# Patient Record
Sex: Male | Born: 1985 | Race: Black or African American | Hispanic: No | Marital: Single | Smoking: Former smoker
Health system: Southern US, Community
[De-identification: ages and names within clinical notes are randomized; demographics above are authoritative.]

## PROBLEM LIST (undated history)

## (undated) DIAGNOSIS — J45909 Unspecified asthma, uncomplicated: Secondary | ICD-10-CM

## (undated) HISTORY — PX: ANTERIOR CRUCIATE LIGAMENT REPAIR: SHX115

## (undated) HISTORY — DX: Unspecified asthma, uncomplicated: J45.909

---

## 2005-04-01 ENCOUNTER — Emergency Department (HOSPITAL_COMMUNITY): Admission: EM | Admit: 2005-04-01 | Discharge: 2005-04-01 | Payer: Self-pay | Admitting: Emergency Medicine

## 2009-01-21 ENCOUNTER — Emergency Department (HOSPITAL_BASED_OUTPATIENT_CLINIC_OR_DEPARTMENT_OTHER): Admission: EM | Admit: 2009-01-21 | Discharge: 2009-01-21 | Payer: Self-pay | Admitting: Emergency Medicine

## 2009-06-30 ENCOUNTER — Emergency Department (HOSPITAL_BASED_OUTPATIENT_CLINIC_OR_DEPARTMENT_OTHER): Admission: EM | Admit: 2009-06-30 | Discharge: 2009-06-30 | Payer: Self-pay | Admitting: Emergency Medicine

## 2009-07-23 ENCOUNTER — Emergency Department (HOSPITAL_BASED_OUTPATIENT_CLINIC_OR_DEPARTMENT_OTHER): Admission: EM | Admit: 2009-07-23 | Discharge: 2009-07-23 | Payer: Self-pay | Admitting: Emergency Medicine

## 2010-04-21 ENCOUNTER — Emergency Department (HOSPITAL_BASED_OUTPATIENT_CLINIC_OR_DEPARTMENT_OTHER): Admission: EM | Admit: 2010-04-21 | Discharge: 2010-04-21 | Payer: Self-pay | Admitting: Emergency Medicine

## 2010-11-24 LAB — RAPID STREP SCREEN (MED CTR MEBANE ONLY): Streptococcus, Group A Screen (Direct): NEGATIVE

## 2012-07-15 ENCOUNTER — Ambulatory Visit: Payer: BC Managed Care – PPO

## 2012-07-15 ENCOUNTER — Ambulatory Visit (INDEPENDENT_AMBULATORY_CARE_PROVIDER_SITE_OTHER): Payer: BC Managed Care – PPO | Admitting: Family Medicine

## 2012-07-15 VITALS — BP 152/75 | HR 80 | Temp 98.0°F | Resp 16 | Ht 74.0 in | Wt 215.0 lb

## 2012-07-15 DIAGNOSIS — M25572 Pain in left ankle and joints of left foot: Secondary | ICD-10-CM

## 2012-07-15 DIAGNOSIS — M25521 Pain in right elbow: Secondary | ICD-10-CM

## 2012-07-15 DIAGNOSIS — T07XXXA Unspecified multiple injuries, initial encounter: Secondary | ICD-10-CM

## 2012-07-15 DIAGNOSIS — M25569 Pain in unspecified knee: Secondary | ICD-10-CM

## 2012-07-15 DIAGNOSIS — M25529 Pain in unspecified elbow: Secondary | ICD-10-CM

## 2012-07-15 DIAGNOSIS — Z23 Encounter for immunization: Secondary | ICD-10-CM

## 2012-07-15 MED ORDER — IBUPROFEN 600 MG PO TABS: 600.0000 mg | ORAL_TABLET | Freq: Three times a day (TID) | ORAL | Status: DC | PRN

## 2012-07-15 MED ORDER — DOXYCYCLINE HYCLATE 100 MG PO TABS: 100.0000 mg | ORAL_TABLET | Freq: Two times a day (BID) | ORAL | Status: DC

## 2012-07-15 MED ORDER — TETANUS-DIPHTH-ACELL PERTUSSIS 5-2.5-18.5 LF-MCG/0.5 IM SUSP: 0.5000 mL | Freq: Once | INTRAMUSCULAR | Status: DC
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
## 2012-07-17 ENCOUNTER — Ambulatory Visit (INDEPENDENT_AMBULATORY_CARE_PROVIDER_SITE_OTHER): Payer: BC Managed Care – PPO | Admitting: Emergency Medicine

## 2012-07-17 VITALS — BP 134/77 | HR 67 | Temp 98.0°F | Resp 16 | Ht 75.0 in | Wt 212.0 lb

## 2012-07-17 DIAGNOSIS — T07XXXA Unspecified multiple injuries, initial encounter: Secondary | ICD-10-CM

## 2012-07-17 DIAGNOSIS — IMO0002 Reserved for concepts with insufficient information to code with codable children: Secondary | ICD-10-CM
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
## 2012-07-30 NOTE — Progress Notes (Signed)
Reviewed and agree.

## 2013-08-05 ENCOUNTER — Ambulatory Visit (INDEPENDENT_AMBULATORY_CARE_PROVIDER_SITE_OTHER): Payer: BC Managed Care – PPO | Admitting: Emergency Medicine

## 2013-08-05 VITALS — BP 132/80 | HR 74 | Temp 98.3°F | Resp 16 | Ht 75.0 in | Wt 219.0 lb

## 2013-08-05 DIAGNOSIS — S139XXA Sprain of joints and ligaments of unspecified parts of neck, initial encounter: Secondary | ICD-10-CM

## 2013-08-05 MED ORDER — NAPROXEN SODIUM 550 MG PO TABS: 550.0000 mg | ORAL_TABLET | Freq: Two times a day (BID) | ORAL | Status: AC

## 2013-08-05 MED ORDER — CYCLOBENZAPRINE HCL 10 MG PO TABS: 10.0000 mg | ORAL_TABLET | Freq: Three times a day (TID) | ORAL | Status: DC | PRN

## 2013-08-05 MED ORDER — HYDROCODONE-ACETAMINOPHEN 5-325 MG PO TABS: 1.0000 | ORAL_TABLET | ORAL | Status: DC | PRN

## 2013-08-05 NOTE — Progress Notes (Signed)
[facility] [address], [place] [address] [phone]  Date:  08/05/2013   Name:  Lawrence David   DOB:  1985/10/07   MRN:  [id number]  PCP:  No primary provider on file.    Chief Complaint: Neck Pain   History of Present Illness:  Lawrence David is a 27 y.o. very pleasant male patient who presents with the following:  Working out last week and awoke with pain across his shoulders and into neck.  Non radiating no neuro symptoms.  No history of injury.  No prior neck injury.  Works Teacher, adult education into components for cabinets.  No headache or back pain.  No improvement with over the counter medications or other home remedies. Denies other complaint or health concern today.   There are no active problems to display for this patient.   Past Medical History  Diagnosis Date  . Asthma     Past Surgical History  Procedure Laterality Date  . Anterior cruciate ligament repair      History  Substance Use Topics  . Smoking status: Former Smoker -- 8 years    Types: Cigars  . Smokeless tobacco: Not on file  . Alcohol Use: Not on file    Family History  Problem Relation Age of Onset  . Hyperlipidemia Father     No Known Allergies  Medication list has been reviewed and updated.  No current outpatient prescriptions on file prior to visit.   No current facility-administered medications on file prior to visit.    Review of Systems:  As per HPI, otherwise negative.    Physical Examination: Filed Vitals:   08/05/13 1424  BP: 132/80  Pulse: 74  Temp: 98.3 F (36.8 C)  Resp: 16   Filed Vitals:   08/05/13 1424  Height: 6\' 3"  (1.905 m)  Weight: 219 lb (99.338 kg)   Body mass index is 27.37 kg/(m^2). Ideal Body Weight: Weight in (lb) to have BMI = 25: 199.6   GEN: WDWN, moderate distressAD, Non-toxic, Alert & Oriented x 3 HEENT: Atraumatic, Normocephalic.  Ears and Nose: No external deformity. EXTR: No  clubbing/cyanosis/edema NEURO: Normal gait.  PSYCH: Normally interactive. Conversant. Not depressed or anxious appearing.  Calm demeanor.  NECK:  Tender with spasm of trapezius.  Neuro intact Back:  Supple and not tender  Assessment and Plan: Cervical strain Anaprox Flexeril vicodin   Signed,  Phillips Odor, MD

## 2013-08-05 NOTE — Patient Instructions (Signed)

## 2015-04-02 ENCOUNTER — Emergency Department (HOSPITAL_BASED_OUTPATIENT_CLINIC_OR_DEPARTMENT_OTHER)
Admission: EM | Admit: 2015-04-02 | Discharge: 2015-04-03 | Disposition: A | Discharge status: Home / Self Care | Payer: BLUE CROSS/BLUE SHIELD | Attending: Emergency Medicine | Admitting: Emergency Medicine

## 2015-04-02 ENCOUNTER — Encounter (HOSPITAL_BASED_OUTPATIENT_CLINIC_OR_DEPARTMENT_OTHER): Payer: Self-pay

## 2015-04-02 DIAGNOSIS — L0231 Cutaneous abscess of buttock: Secondary | ICD-10-CM | POA: Diagnosis present

## 2015-04-02 DIAGNOSIS — J45909 Unspecified asthma, uncomplicated: Secondary | ICD-10-CM | POA: Diagnosis not present

## 2015-04-02 DIAGNOSIS — Z87891 Personal history of nicotine dependence: Secondary | ICD-10-CM | POA: Diagnosis not present

## 2015-04-02 MED ORDER — LIDOCAINE-EPINEPHRINE 2 %-1:100000 IJ SOLN
INTRAMUSCULAR | Status: AC
  Filled 2015-04-02: qty 1

## 2015-04-02 NOTE — Discharge Instructions (Signed)

## 2015-04-02 NOTE — ED Provider Notes (Addendum)
CSN: [id number]     Arrival date & time 04/02/15  2329 History   This chart was scribed for Lawrence Libra, MD by Arlan Organ, ED Scribe. This patient was seen in room MH02/MH02 and the patient's care was started 11:40 PM.   Chief Complaint  Patient presents with  . Abscess   HPI  HPI Comments: MACKEY VARRICCHIO is a 29 y.o. male without any pertinent past medical history who presents to the Emergency Department here for an abscess to the R inner buttock x 3-4 days. Pt states area is painful, itchy, and swollen. Pain is moderate to severe, worse when attempting to sit down and when ambulating. No alleviating factors at this time. OTC ibuprofen attempted at home with no improvement of symptoms. Topical anti-itch ointment also attempted with no relief. No recent fever or chills. No previous history of same requiring I&D treatment.  Past Medical History  Diagnosis Date  . Asthma    Past Surgical History  Procedure Laterality Date  . Anterior cruciate ligament repair     Family History  Problem Relation Age of Onset  . Hyperlipidemia Father    Social History  Substance Use Topics  . Smoking status: Former Smoker -- 8 years    Types: Cigars  . Smokeless tobacco: None  . Alcohol Use: Yes     Comment: social    Review of Systems  All other systems reviewed and are negative.   Allergies  Review of patient's allergies indicates no known allergies.  Home Medications   Prior to Admission medications   Medication Sig Start Date End Date Taking? Authorizing Provider  cyclobenzaprine (FLEXERIL) 10 MG tablet Take 1 tablet (10 mg total) by mouth 3 (three) times daily as needed for muscle spasms. 08/05/13   Carmelina Dane, MD  HYDROcodone-acetaminophen (NORCO) 5-325 MG per tablet Take 1-2 tablets by mouth every 4 (four) hours as needed for moderate pain. 08/05/13   Carmelina Dane, MD   Triage Vitals: BP 133/78 mmHg  Pulse 93  Temp(Src) 99 F (37.2 C) (Oral)  Resp 16  Ht 6'  3" (1.905 m)  Wt 225 lb (102.059 kg)  BMI 28.12 kg/m2  SpO2 100%   Physical Exam General: Well-developed, well-nourished male in no acute distress; appearance consistent with age of record HENT: normocephalic; atraumatic Eyes: Normal appearance  Neck: supple Heart: regular rate and rhythm Lungs: clear to auscultation bilaterally Abdomen: soft; nondistended; nontender Extremities: No deformity; full range of motion; pulses normal Neurologic: Awake, alert and oriented; motor function intact in all extremities and symmetric; no facial droop Skin: Warm and dry; large abscess of medial R buttock with small abscess of medial L buttock Psychiatric: Normal mood and affect    ED Course  Procedures (including critical care time)  DIAGNOSTIC STUDIES: Oxygen Saturation is 100% on RA, Normal by my interpretation.    COORDINATION OF CARE: 11:41 PM- Will perform I&D. Discussed treatment plan with pt at bedside and pt agreed to plan.     INCISION AND DRAINAGE PROCEDURE NOTE: Patient identification was confirmed and verbal consent was obtained. This procedure was performed by Lawrence Libra, MD at 11:43 PM. Site: medial R buttock and medial L buttock Sterile procedures observed Anesthetic used (type and amt): 2% lidocaine with epinephrine, 3mL  Blade size: 11 Drainage: Purulent and scant drainage from L medial buttock. Purulent and moderate drainage from R medial buttock Complexity: Complex Packing used: None  Site anesthetized, incision made over site, wound drained and explored loculations,  rinsed with copious amounts of normal saline, right buttock wound packed with sterile gauze, covered with dry, sterile dressing.  Pt tolerated procedure well without complications.  Instructions for care discussed verbally and pt provided with additional written instructions for homecare and f/u.   Patient advised to remove the packing in 48 hours or return if symptoms are worsening instead of improving  over the next 48 hours.  MDM  I personally performed the services described in this documentation, which was scribed in my presence. The recorded information has been reviewed and is accurate.   Lawrence Libra, MD 04/02/15 2357  Lawrence Libra, MD 04/02/15 [phone]

## 2015-04-02 NOTE — ED Notes (Signed)
Pt reports right inner buttocks abscess x3-4 days. Reports pain, itching, redness, swelling at site.

## 2015-04-06 ENCOUNTER — Emergency Department (HOSPITAL_BASED_OUTPATIENT_CLINIC_OR_DEPARTMENT_OTHER)
Admission: EM | Admit: 2015-04-06 | Discharge: 2015-04-06 | Disposition: A | Discharge status: Home / Self Care | Payer: BLUE CROSS/BLUE SHIELD | Attending: Emergency Medicine | Admitting: Emergency Medicine

## 2015-04-06 ENCOUNTER — Encounter (HOSPITAL_BASED_OUTPATIENT_CLINIC_OR_DEPARTMENT_OTHER): Payer: Self-pay | Admitting: Emergency Medicine

## 2015-04-06 DIAGNOSIS — Z87891 Personal history of nicotine dependence: Secondary | ICD-10-CM | POA: Insufficient documentation

## 2015-04-06 DIAGNOSIS — M791 Myalgia: Secondary | ICD-10-CM | POA: Insufficient documentation

## 2015-04-06 DIAGNOSIS — R5383 Other fatigue: Secondary | ICD-10-CM | POA: Diagnosis not present

## 2015-04-06 DIAGNOSIS — Z4801 Encounter for change or removal of surgical wound dressing: Secondary | ICD-10-CM | POA: Diagnosis not present

## 2015-04-06 DIAGNOSIS — R509 Fever, unspecified: Secondary | ICD-10-CM | POA: Diagnosis not present

## 2015-04-06 DIAGNOSIS — Z5189 Encounter for other specified aftercare: Secondary | ICD-10-CM

## 2015-04-06 DIAGNOSIS — J45909 Unspecified asthma, uncomplicated: Secondary | ICD-10-CM | POA: Insufficient documentation

## 2015-04-06 LAB — CBC WITH DIFFERENTIAL/PLATELET
BASOS ABS: 0 10*3/uL (ref 0.0–0.1)
Basophils Relative: 0 % (ref 0–1)
EOS ABS: 0.3 10*3/uL (ref 0.0–0.7)
EOS PCT: 4 % (ref 0–5)
HEMATOCRIT: 37.2 % — AB (ref 39.0–52.0)
Hemoglobin: 11.9 g/dL — ABNORMAL LOW (ref 13.0–17.0)
LYMPHS PCT: 18 % (ref 12–46)
Lymphs Abs: 1.3 10*3/uL (ref 0.7–4.0)
MCH: 23.2 pg — ABNORMAL LOW (ref 26.0–34.0)
MCHC: 32 g/dL (ref 30.0–36.0)
MCV: 72.7 fL — AB (ref 78.0–100.0)
MONO ABS: 1 10*3/uL (ref 0.1–1.0)
MONOS PCT: 14 % — AB (ref 3–12)
NEUTROS PCT: 64 % (ref 43–77)
Neutro Abs: 4.5 10*3/uL (ref 1.7–7.7)
PLATELETS: 202 10*3/uL (ref 150–400)
RBC: 5.12 MIL/uL (ref 4.22–5.81)
RDW: 13.9 % (ref 11.5–15.5)
WBC: 7.1 10*3/uL (ref 4.0–10.5)

## 2015-04-06 MED ORDER — DEXTROSE 5 % IV SOLN
1.0000 g | Freq: Once | INTRAVENOUS | Status: AC
  Administered 2015-04-06: 1 g via INTRAVENOUS

## 2015-04-06 MED ORDER — CEPHALEXIN 500 MG PO CAPS: 500.0000 mg | ORAL_CAPSULE | Freq: Four times a day (QID) | ORAL | Status: DC

## 2015-04-06 MED ORDER — CEFTRIAXONE SODIUM 1 G IJ SOLR
INTRAMUSCULAR | Status: AC
  Filled 2015-04-06: qty 10

## 2015-04-06 MED ORDER — HYDROCODONE-ACETAMINOPHEN 5-325 MG PO TABS: 1.0000 | ORAL_TABLET | ORAL | Status: DC | PRN

## 2015-04-06 NOTE — ED Notes (Signed)
Rx x 2 given for keflex and vicodin- d/c home with ride

## 2015-04-06 NOTE — ED Notes (Signed)
MD at bedside. 

## 2015-04-06 NOTE — ED Notes (Signed)
Patient here for wound check from 8/12. Pt complains of pain 7. Ibueprofin at home. No antibiotic prescribed.

## 2015-04-06 NOTE — Discharge Instructions (Signed)

## 2015-04-06 NOTE — ED Provider Notes (Signed)
CSN: [id number]     Arrival date & time 04/06/15  1628 History  This chart was scribed for Lawrence Nay, MD by Placido Sou, ED scribe. This patient was seen in room MH10/MH10 and the patient's care was started at 4:57 PM.    Chief Complaint  Patient presents with  . Wound Check   The history is provided by the patient. No language interpreter was used.    HPI Comments: [name] is a 29 y.o. male who presents to the Emergency Department for a wound check to his right inner buttock s/p an I&D that was performed on 04/02/15. He notes associated pain, drainage, fatigue and an intermittent, mild, diffuse, febrile feeling. Pt notes taking ibuprofen as needed for pain management. 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 Pt denies any other associated symptoms.   Past Medical History  Diagnosis Date  . Asthma    Past Surgical History  Procedure Laterality Date  . Anterior cruciate ligament repair     Family History  Problem Relation Age of Onset  . Hyperlipidemia Father    Social History  Substance Use Topics  . Smoking status: Former Smoker -- 8 years    Types: Cigars  . Smokeless tobacco: None  . Alcohol Use: Yes     Comment: social    Review of Systems  Constitutional: Positive for fever and fatigue.  Skin: Positive for color change and wound.  All other systems reviewed and are negative.  Allergies  Review of patient's allergies indicates no known allergies.  Home Medications   Prior to Admission medications   Medication Sig Start Date End Date Taking? Authorizing Provider  cephALEXin (KEFLEX) 500 MG capsule Take 1 capsule (500 mg total) by mouth 4 (four) times daily. 04/06/15   Lawrence Nay, MD  cyclobenzaprine (FLEXERIL) 10 MG tablet Take 1 tablet (10 mg total) by mouth 3 (three) times daily as needed for muscle spasms. 08/05/13   Carmelina Dane, MD  HYDROcodone-acetaminophen (NORCO) 5-325 MG per tablet Take 1-2 tablets by mouth  every 4 (four) hours as needed for moderate pain. 04/06/15   Lawrence Nay, MD   BP 127/52 mmHg  Pulse 88  Temp(Src) 99.9 F (37.7 C) (Oral)  Resp 16  Ht  (1.905 m)  Wt 225 lb (102.059 kg)  BMI 28.12 kg/m2  SpO2 98% Physical Exam Physical Exam  Nursing note and vitals reviewed. Constitutional: He is oriented to person, place, and time. He appears well-developed and well-nourished. No distress.  HENT:  Head: Normocephalic and atraumatic.  Eyes: Pupils are equal, round, and reactive to light.  Neck: Normal range of motion.  Cardiovascular: Normal rate and intact distal pulses.   Pulmonary/Chest: No respiratory distress.  Abdominal: Normal appearance. He exhibits no distension.  Musculoskeletal: Normal range of motion.  buttock abscess is draining.  Manipulation increase some drainage from that. Neurological: He is alert and oriented to person, place, and time. No cranial nerve deficit.  Skin: Skin is warm and dry. No rash noted.  Psychiatric: He has a normal mood and affect. His behavior is normal.   ED Course  Procedures  DIAGNOSTIC STUDIES: Oxygen Saturation is 97% on RA, normal by my interpretation.    COORDINATION OF CARE: 5:00 PM Discussed treatment plan with pt at bedside and pt agreed to plan.  Labs Review Labs Reviewed  CBC WITH DIFFERENTIAL/PLATELET - Abnormal; Notable for the following:    Hemoglobin 11.9 (*)    HCT 37.2 (*)  MCV 72.7 (*)    MCH 23.2 (*)    Monocytes Relative 14 (*)    All other components within normal limits    Imaging Review No results found. I, No att. providers found, personally reviewed and evaluated these images and lab results as part of my medical decision-making.     MDM   Final diagnoses:  Visit for wound check   I personally performed the services described in this documentation, which was scribed in my presence. The recorded information has been reviewed and considered.    Lawrence Nay, MD 04/19/15 [phone]

## 2015-10-01 ENCOUNTER — Ambulatory Visit (INDEPENDENT_AMBULATORY_CARE_PROVIDER_SITE_OTHER): Payer: BLUE CROSS/BLUE SHIELD | Admitting: Family Medicine

## 2015-10-01 VITALS — BP 120/80 | HR 98 | Temp 98.4°F | Resp 20 | Ht 76.0 in | Wt 236.4 lb

## 2015-10-01 DIAGNOSIS — R509 Fever, unspecified: Secondary | ICD-10-CM | POA: Diagnosis not present

## 2015-10-01 DIAGNOSIS — R0981 Nasal congestion: Secondary | ICD-10-CM | POA: Diagnosis not present

## 2015-10-01 DIAGNOSIS — R05 Cough: Secondary | ICD-10-CM

## 2015-10-01 DIAGNOSIS — J4520 Mild intermittent asthma, uncomplicated: Secondary | ICD-10-CM | POA: Diagnosis not present

## 2015-10-01 DIAGNOSIS — J101 Influenza due to other identified influenza virus with other respiratory manifestations: Secondary | ICD-10-CM

## 2015-10-01 DIAGNOSIS — R5383 Other fatigue: Secondary | ICD-10-CM

## 2015-10-01 LAB — POCT INFLUENZA A/B
INFLUENZA A, POC: POSITIVE — AB
INFLUENZA B, POC: NEGATIVE

## 2015-10-01 MED ORDER — OSELTAMIVIR PHOSPHATE 75 MG PO CAPS: 75.0000 mg | ORAL_CAPSULE | Freq: Two times a day (BID) | ORAL | Status: DC

## 2015-10-01 MED ORDER — HYDROCOD POLST-CPM POLST ER 10-8 MG/5ML PO SUER: 5.0000 mL | Freq: Two times a day (BID) | ORAL | Status: DC | PRN

## 2015-10-01 MED ORDER — ALBUTEROL SULFATE HFA 108 (90 BASE) MCG/ACT IN AERS: 2.0000 | INHALATION_SPRAY | RESPIRATORY_TRACT | Status: DC | PRN

## 2015-10-01 NOTE — Patient Instructions (Signed)

## 2015-10-01 NOTE — Progress Notes (Signed)
Subjective:  By signing my name below, I, Stann Ore, attest that this documentation has been prepared under the direction and in the presence of Norberto Sorenson, MD. Electronically Signed: Stann Ore, Scribe. 10/01/2015 , 2:02 PM .  Patient was seen in Room 8 .   Patient ID: Lawrence David, male    DOB: 11-13-1985, 30 y.o.   MRN: [id number] Chief Complaint  Patient presents with  . Influenza    yesterday   . Fever   HPI Lawrence David is a 30 y.o. male who presents to [facility] complaining of flu-like symptoms that started up 2 days ago. He's been taking alka-seltzers for mild relief. He's been waking up at night during sleep to coughs. He's noticed rhinorrhea with fever (tmax 100.2) this morning. He has a history of asthma. He does not have an inhaler. He denies sore throat. He also has decreased appetite. He hasn't been able to eat anything today.   He denies receiving flu shot this year.  He missed work yesterday.   Past Medical History  Diagnosis Date  . Asthma    Prior to Admission medications   Medication Sig Start Date End Date Taking? Authorizing Provider  cephALEXin (KEFLEX) 500 MG capsule Take 1 capsule (500 mg total) by mouth 4 (four) times daily. Patient not taking: Reported on 10/01/2015 04/06/15   Nelva Nay, MD  cyclobenzaprine (FLEXERIL) 10 MG tablet Take 1 tablet (10 mg total) by mouth 3 (three) times daily as needed for muscle spasms. Patient not taking: Reported on 10/01/2015 08/05/13   Carmelina Dane, MD  HYDROcodone-acetaminophen [facility]) 5-325 MG per tablet Take 1-2 tablets by mouth every 4 (four) hours as needed for moderate pain. Patient not taking: Reported on 10/01/2015 04/06/15   Nelva Nay, MD   No Known Allergies  Review of Systems  Constitutional: Positive for fever, appetite change and fatigue. Negative for chills.  HENT: Positive for congestion, rhinorrhea and sneezing. Negative for sore throat.   Respiratory: Positive for cough.     Gastrointestinal: Negative for nausea, vomiting, abdominal pain, diarrhea and constipation.       Objective:   Physical Exam  Constitutional: He is oriented to person, place, and time. He appears well-developed and well-nourished. No distress.  HENT:  Head: Normocephalic and atraumatic.  Right Ear: Tympanic membrane is injected and retracted.  Left Ear: Tympanic membrane is injected and retracted.  Nose: Mucosal edema (with erythema) and rhinorrhea present.  Mouth/Throat: Posterior oropharyngeal erythema present.  Eyes: EOM are normal. Pupils are equal, round, and reactive to light.  Neck: Neck supple.  Cardiovascular: Normal rate, regular rhythm, S1 normal, S2 normal and normal heart sounds.   No murmur heard. Pulmonary/Chest: Effort normal and breath sounds normal. No respiratory distress. He has no wheezes.  Musculoskeletal: Normal range of motion.  Neurological: He is alert and oriented to person, place, and time.  Skin: Skin is warm and dry.  Psychiatric: He has a normal mood and affect. His behavior is normal.  Nursing note and vitals reviewed.   BP 120/80 mmHg  Pulse 98  Temp(Src) 98.4 F (36.9 C) (Oral)  Resp 20  Ht  (1.93 m)  Wt 236 lb 6.4 oz (107.23 kg)  BMI 28.79 kg/m2  SpO2 96%   Results for orders placed or performed in visit on 10/01/15  POCT Influenza A/B  Result Value Ref Range   Influenza A, POC Positive (A) Negative   Influenza B, POC Negative Negative  Assessment & Plan:   1. Fever, unspecified   2. Influenza A   3. Asthma, mild intermittent, uncomplicated     Orders Placed This Encounter  Procedures  . POCT Influenza A/B    Meds ordered this encounter  Medications  . oseltamivir (TAMIFLU) 75 MG capsule    Sig: Take 1 capsule (75 mg total) by mouth 2 (two) times daily.    Dispense:  10 capsule    Refill:  0  . chlorpheniramine-HYDROcodone (TUSSIONEX PENNKINETIC ER) 10-8 MG/5ML SUER    Sig: Take 5 mLs by mouth every 12 (twelve)  hours as needed.    Dispense:  120 mL    Refill:  0  . albuterol (PROVENTIL HFA;VENTOLIN HFA) 108 (90 Base) MCG/ACT inhaler    Sig: Inhale 2 puffs into the lungs every 4 (four) hours as needed for wheezing or shortness of breath (cough, shortness of breath or wheezing.).    Dispense:  1 Inhaler    Refill:  1    Ok to switch to any type of albuterol inh that works best w/ his insurance    I personally performed the services described in this documentation, which was scribed in my presence. The recorded information has been reviewed and considered, and addended by me as needed.  Norberto Sorenson, MD MPH

## 2016-03-07 ENCOUNTER — Emergency Department (HOSPITAL_COMMUNITY): Payer: BLUE CROSS/BLUE SHIELD

## 2016-03-07 ENCOUNTER — Emergency Department (HOSPITAL_COMMUNITY)
Admission: EM | Admit: 2016-03-07 | Discharge: 2016-03-07 | Disposition: A | Discharge status: Home / Self Care | Payer: BLUE CROSS/BLUE SHIELD | Attending: Physician Assistant | Admitting: Physician Assistant

## 2016-03-07 ENCOUNTER — Encounter (HOSPITAL_COMMUNITY): Payer: Self-pay | Admitting: Oncology

## 2016-03-07 DIAGNOSIS — R079 Chest pain, unspecified: Secondary | ICD-10-CM | POA: Diagnosis not present

## 2016-03-07 DIAGNOSIS — S32038A Other fracture of third lumbar vertebra, initial encounter for closed fracture: Secondary | ICD-10-CM | POA: Diagnosis not present

## 2016-03-07 DIAGNOSIS — S32039A Unspecified fracture of third lumbar vertebra, initial encounter for closed fracture: Secondary | ICD-10-CM | POA: Diagnosis not present

## 2016-03-07 DIAGNOSIS — Z79899 Other long term (current) drug therapy: Secondary | ICD-10-CM | POA: Diagnosis not present

## 2016-03-07 DIAGNOSIS — Z791 Long term (current) use of non-steroidal anti-inflammatories (NSAID): Secondary | ICD-10-CM | POA: Diagnosis not present

## 2016-03-07 DIAGNOSIS — Z87891 Personal history of nicotine dependence: Secondary | ICD-10-CM | POA: Diagnosis not present

## 2016-03-07 DIAGNOSIS — S32028A Other fracture of second lumbar vertebra, initial encounter for closed fracture: Secondary | ICD-10-CM | POA: Diagnosis not present

## 2016-03-07 DIAGNOSIS — S32009A Unspecified fracture of unspecified lumbar vertebra, initial encounter for closed fracture: Secondary | ICD-10-CM | POA: Diagnosis not present

## 2016-03-07 DIAGNOSIS — S32029A Unspecified fracture of second lumbar vertebra, initial encounter for closed fracture: Secondary | ICD-10-CM | POA: Diagnosis not present

## 2016-03-07 DIAGNOSIS — J45909 Unspecified asthma, uncomplicated: Secondary | ICD-10-CM | POA: Diagnosis not present

## 2016-03-07 DIAGNOSIS — S32048A Other fracture of fourth lumbar vertebra, initial encounter for closed fracture: Secondary | ICD-10-CM | POA: Diagnosis not present

## 2016-03-07 DIAGNOSIS — Y929 Unspecified place or not applicable: Secondary | ICD-10-CM | POA: Insufficient documentation

## 2016-03-07 DIAGNOSIS — W108XXA Fall (on) (from) other stairs and steps, initial encounter: Secondary | ICD-10-CM | POA: Insufficient documentation

## 2016-03-07 DIAGNOSIS — Y999 Unspecified external cause status: Secondary | ICD-10-CM | POA: Insufficient documentation

## 2016-03-07 DIAGNOSIS — Y9367 Activity, basketball: Secondary | ICD-10-CM | POA: Diagnosis not present

## 2016-03-07 DIAGNOSIS — M545 Low back pain: Secondary | ICD-10-CM | POA: Diagnosis present

## 2016-03-07 MED ORDER — ONDANSETRON HCL 4 MG/2ML IJ SOLN
4.0000 mg | Freq: Once | INTRAMUSCULAR | Status: AC
  Administered 2016-03-07: 4 mg via INTRAVENOUS
  Filled 2016-03-07: qty 2

## 2016-03-07 MED ORDER — OXYCODONE-ACETAMINOPHEN 5-325 MG PO TABS
1.0000 | ORAL_TABLET | Freq: Once | ORAL | Status: DC
  Filled 2016-03-07: qty 1

## 2016-03-07 MED ORDER — OXYCODONE-ACETAMINOPHEN 5-325 MG PO TABS: 1.0000 | ORAL_TABLET | Freq: Four times a day (QID) | ORAL | Status: DC | PRN

## 2016-03-07 MED ORDER — MORPHINE SULFATE (PF) 4 MG/ML IV SOLN
4.0000 mg | Freq: Once | INTRAVENOUS | Status: AC
  Administered 2016-03-07: 4 mg via INTRAVENOUS
  Filled 2016-03-07: qty 1

## 2016-03-07 MED ORDER — OXYCODONE-ACETAMINOPHEN 5-325 MG PO TABS
1.0000 | ORAL_TABLET | Freq: Once | ORAL | Status: AC
  Administered 2016-03-07: 1 via ORAL
  Filled 2016-03-07: qty 1
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
## 2016-03-07 NOTE — ED Notes (Signed)
Pt wheeled out to car at discharge. Pt verbalized understanding of discharge instructions.

## 2016-03-07 NOTE — Discharge Instructions (Signed)
You had a fall. You have a fracture in the transverse process.  Please follow up with your PCP to help with pain magnagment.  You may need to follow upw with an orthopeadist.  Lumbar Fracture A lumbar fracture is a break in one of the bones of the lower back. Lumbar fractures range in severity. Severe fractures can damage the spinal cord. CAUSES This condition may be caused by:  A fall (common).  A car accident (common).  A gunshot wound.  A hard, direct hit to the back.  Osteoporosis. SYMPTOMS The main symptom of this condition is severe pain in the lower back. If a fracture is complex or severe, there may also be:  A misshapen or swollen area on the lower back.  A limited ability to move an area of the lower back.  An inability to empty the bladder or bowel.  A loss of strength or sensation in the legs, feet, and toes.  Paralysis. DIAGNOSIS This condition is diagnosed based on:  A physical exam.  Symptoms and what happened just before they developed.  The results of imaging tests, such as an X-ray, CT scan, or MRI. If your nerves have been damaged, you may also have other tests to find out how much damage there is. TREATMENT Treatment for this condition depends on the specifics of the injury. Most fractures can be treated with:  A back brace.  Bed rest and activity restrictions.  Pain medicine.  Physical therapy. Fractures that are complex, involve multiple bones, or make the spine unstable may require surgery to remove pressure from the nerves or spinal cord and to stabilize the broken pieces of bone. During recovery, it is normal to have pain and stiffness in the back for weeks. HOME CARE INSTRUCTIONS Medicines  Take medicines only as directed by your health care provider.  Do not drive or operate heavy machinery while taking pain medicine. Activity  Stay in bed for as long as directed by your health care provider.  If you were shown how to do any  exercises to improve motion and strength in your back, do them as directed by your health care provider.  Return to your normal activities as directed by your health care provider. Ask your health care provider what activities are safe for you. General Instructions  If you were given a neck brace or back brace, wear it as directed by your health care provider.  Keep all follow-up visits as directed by your health care provider. This is important. Failure to follow-up as recommended could result in permanent injury, disability, and long-lasting (chronic) pain. SEEK MEDICAL CARE IF:  Your pain does not improve over time.  You have a persistent cough.  You cannot return to your normal activities as planned or expected. SEEK IMMEDIATE MEDICAL CARE IF:  You have severe pain or your pain suddenly gets worse.  You are unable to move.  You have numbness, tingling, weakness, or paralysis in any part of your body.  You cannot control your bladder or bowel.  You have difficulty breathing.  You have a fever.  You have pain in your chest or abdomen.  You vomit.   This information is not intended to replace advice given to you by your health care provider. Make sure you discuss any questions you have with your health care provider.   Document Released: 11/24/2006 Document Revised: 12/24/2014 Document Reviewed: 08/05/2014 Elsevier Interactive Patient Education [employer]2016 Elsevier Inc.

## 2016-03-07 NOTE — ED Notes (Signed)
Pt in imaging

## 2016-03-07 NOTE — ED Notes (Signed)
Pt was playing basketball when he jumped up and another player went underneath him causing pt to flip into the bleachers landing on his right side.  Pt c/o pain on right side back/rib cage to right hip.  No bruising noted.  Per pt he can ambulate however it causes severe pain.  Pt is A&O x 4.

## 2016-03-07 NOTE — ED Provider Notes (Signed)
CSN: [id number]     Arrival date & time 03/07/16  1931 History   First MD Initiated Contact with Patient 03/07/16 1955     Chief Complaint  Patient presents with  . Right Sided Pain      (Consider location/radiation/quality/duration/timing/severity/associated sxs/prior Treatment) HPI   Patient is a very pleasant 30 year old male with pain to the right hand side after fall. Patient was played basketball and fell onto the bleachers. Patient has pain radiating down his leg. Has minor contusion to  Back.   Patient is full strength in knee and ankle.  Past Medical History  Diagnosis Date  . Asthma    Past Surgical History  Procedure Laterality Date  . Anterior cruciate ligament repair     Family History  Problem Relation Age of Onset  . Hyperlipidemia Father    Social History  Substance Use Topics  . Smoking status: Former Smoker -- 8 years    Types: Cigars  . Smokeless tobacco: None  . Alcohol Use: Yes     Comment: social    Review of Systems  Constitutional: Negative for fever and activity change.  Respiratory: Negative for shortness of breath.   Cardiovascular: Negative for chest pain.  Gastrointestinal: Negative for nausea and vomiting.  Musculoskeletal: Positive for back pain and gait problem.  Skin: Negative for rash.  Allergic/Immunologic: Negative for immunocompromised state.  Neurological: Negative for light-headedness.  All other systems reviewed and are negative.     Allergies  Review of patient's allergies indicates no known allergies.  Home Medications   Prior to Admission medications   Medication Sig Start Date End Date Taking? Authorizing Provider  acetaminophen (TYLENOL) 500 MG tablet Take 1,000 mg by mouth every 6 (six) hours as needed for mild pain or headache.   Yes Historical Provider, MD  ibuprofen (ADVIL,MOTRIN) 200 MG tablet Take 800 mg by mouth every 6 (six) hours as needed for headache or mild pain.   Yes Historical Provider, MD   loratadine (CLARITIN) 10 MG tablet Take 10 mg by mouth daily as needed for allergies.   Yes Historical Provider, MD  albuterol (PROVENTIL HFA;VENTOLIN HFA) 108 (90 Base) MCG/ACT inhaler Inhale 2 puffs into the lungs every 4 (four) hours as needed for wheezing or shortness of breath (cough, shortness of breath or wheezing.). Patient not taking: Reported on 03/07/2016 10/01/15   Sherren MochaEva N Shaw, MD  chlorpheniramine-HYDROcodone [facility](TUSSIONEX PENNKINETIC ER) 10-8 MG/5ML SUER Take 5 mLs by mouth every 12 (twelve) hours as needed. Patient not taking: Reported on 03/07/2016 10/01/15   Sherren MochaEva N Shaw, MD  oseltamivir (TAMIFLU) 75 MG capsule Take 1 capsule (75 mg total) by mouth 2 (two) times daily. Patient not taking: Reported on 03/07/2016 10/01/15   Sherren MochaEva N Shaw, MD   BP 127/82 mmHg  Pulse 73  Temp(Src) 98.1 F (36.7 C) (Oral)  Resp 15  SpO2 99% Physical Exam  Constitutional: He is oriented to person, place, and time. He appears well-nourished.  HENT:  Head: Normocephalic.  Mouth/Throat: Oropharynx is clear and moist.  Eyes: Conjunctivae are normal.  Neck: No tracheal deviation present.  Cardiovascular: Normal rate.   Pulmonary/Chest: Effort normal. No stridor. No respiratory distress.  Abdominal: Soft. There is no tenderness. There is no guarding.  Musculoskeletal: Normal range of motion. He exhibits no edema.  No tenderness to palpation along the CT or L-spine. Patient does have pain with moving the right leg at the hip joint. Normal strength distally.   Neurological: He is oriented to person, place,  and time. No cranial nerve deficit.  Skin: Skin is warm and dry. No rash noted. He is not diaphoretic.  Minor erythema in upper lateral back. No break in  skin  Psychiatric: He has a normal mood and affect. His behavior is normal.  Nursing note and vitals reviewed.   ED Course  Procedures (including critical care time) Labs Review Labs Reviewed - No data to display  Imaging Review Dg Chest 2  View  03/07/2016  CLINICAL DATA:  Pain after trauma EXAM: CHEST  2 VIEW COMPARISON:  None. FINDINGS: The heart size and mediastinal contours are within normal limits. Both lungs are clear. The visualized skeletal structures are unremarkable. IMPRESSION: No active cardiopulmonary disease. Electronically Signed   By: Gerome Sam III M.D   On: 03/07/2016 22:27   Ct Lumbar Spine Wo Contrast  03/07/2016  CLINICAL DATA:  Fall today with severe back pain. Initial encounter. EXAM: CT LUMBAR SPINE WITHOUT CONTRAST TECHNIQUE: Multidetector CT imaging of the lumbar spine was performed without intravenous contrast administration. Multiplanar CT image reconstructions were also generated. COMPARISON:  None. FINDINGS: Normal lumbar segmentation. Mildly displaced right L2, L3, L4 transverse process fractures. No evidence of vertebral body fracture. No traumatic malalignment. No visible disc herniation or stenosis. Negative visualized retroperitoneum. IMPRESSION: Mildly displaced L2, L3, and L4 right transverse process fractures. Electronically Signed   By: Marnee Spring M.D.   On: 03/07/2016 22:43   I have personally reviewed and evaluated these images and lab results as part of my medical decision-making.   EKG Interpretation None      MDM   Final diagnoses:  None   Patient is a 30 year old -year-old male presenting after trauma. Patient fell onto bleachers and sustained pain to the back. We will get CT of the lumbar spine. Patient has normal strength sensation distally. Does have pain with movement.   11:03 PM CT shows TP fractures.  Will continue to help with pain management, will discharge home.   Daqwan Dougal Randall An, MD 03/07/16 2303

## 2016-03-08 DIAGNOSIS — M545 Low back pain: Secondary | ICD-10-CM | POA: Diagnosis not present

## 2016-03-22 DIAGNOSIS — M545 Low back pain: Secondary | ICD-10-CM | POA: Diagnosis not present

## 2016-04-05 DIAGNOSIS — M545 Low back pain: Secondary | ICD-10-CM | POA: Diagnosis not present

## 2016-10-14 DIAGNOSIS — Z113 Encounter for screening for infections with a predominantly sexual mode of transmission: Secondary | ICD-10-CM | POA: Diagnosis not present

## 2016-10-14 DIAGNOSIS — J339 Nasal polyp, unspecified: Secondary | ICD-10-CM | POA: Diagnosis not present

## 2016-10-14 DIAGNOSIS — J45909 Unspecified asthma, uncomplicated: Secondary | ICD-10-CM | POA: Diagnosis not present

## 2016-10-14 DIAGNOSIS — Z0000 Encounter for general adult medical examination without abnormal findings: Secondary | ICD-10-CM | POA: Diagnosis not present

## 2016-10-14 DIAGNOSIS — Z1389 Encounter for screening for other disorder: Secondary | ICD-10-CM | POA: Diagnosis not present

## 2016-10-14 DIAGNOSIS — Z23 Encounter for immunization: Secondary | ICD-10-CM | POA: Diagnosis not present

## 2016-11-03 DIAGNOSIS — J069 Acute upper respiratory infection, unspecified: Secondary | ICD-10-CM | POA: Diagnosis not present

## 2016-11-11 DIAGNOSIS — E119 Type 2 diabetes mellitus without complications: Secondary | ICD-10-CM | POA: Diagnosis not present

## 2018-03-14 IMAGING — CT CT L SPINE W/O CM
3 of 4 series · 11 of 33 positions shown, 13 images · non-contrast
Comparison: None.

CLINICAL DATA: Fall today with severe back pain. Initial encounter.

EXAM:
CT LUMBAR SPINE WITHOUT CONTRAST
TECHNIQUE: Multidetector CT imaging of the lumbar spine was performed without
intravenous contrast administration. Multiplanar CT image
reconstructions were also generated.

[Series 2: l-spine · axial · 0.28mm/px · z∈[-556,-420]mm · 3 of 122 slices shown, 4 images]
[im 27/122  soft-tissue]
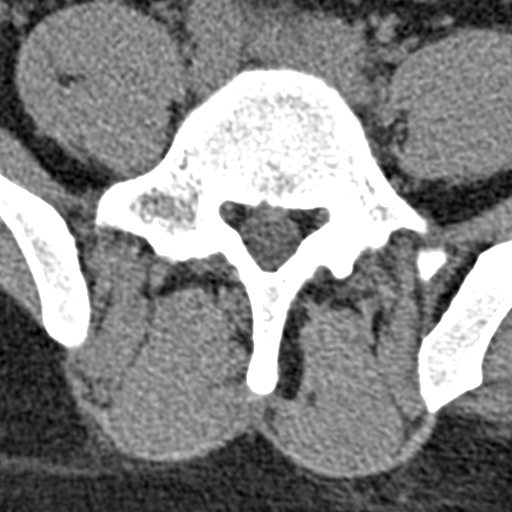
[im 27/122  bone]
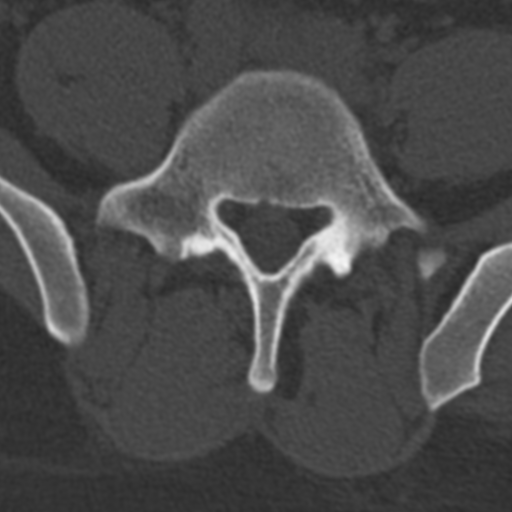
[im 68/122  bone]
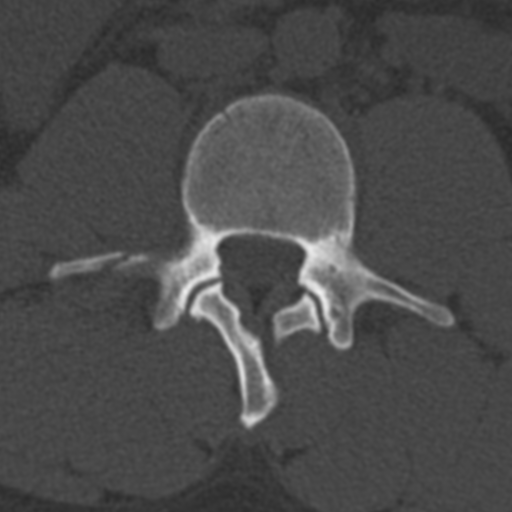
[im 95/122  bone]
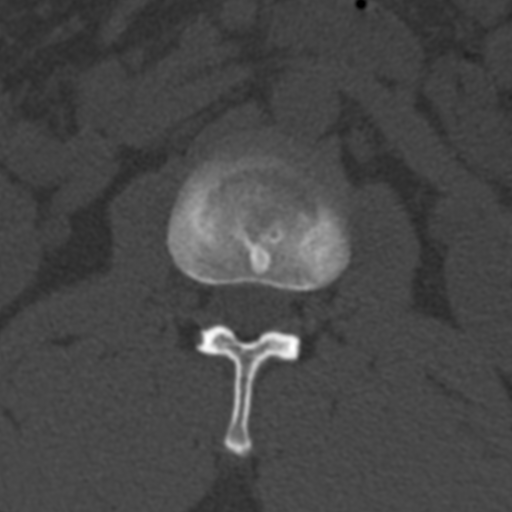

[Series 4: coronal · coronal · 0.25mm/px · 3 of 61 slices shown]
[im 13/61  bone]
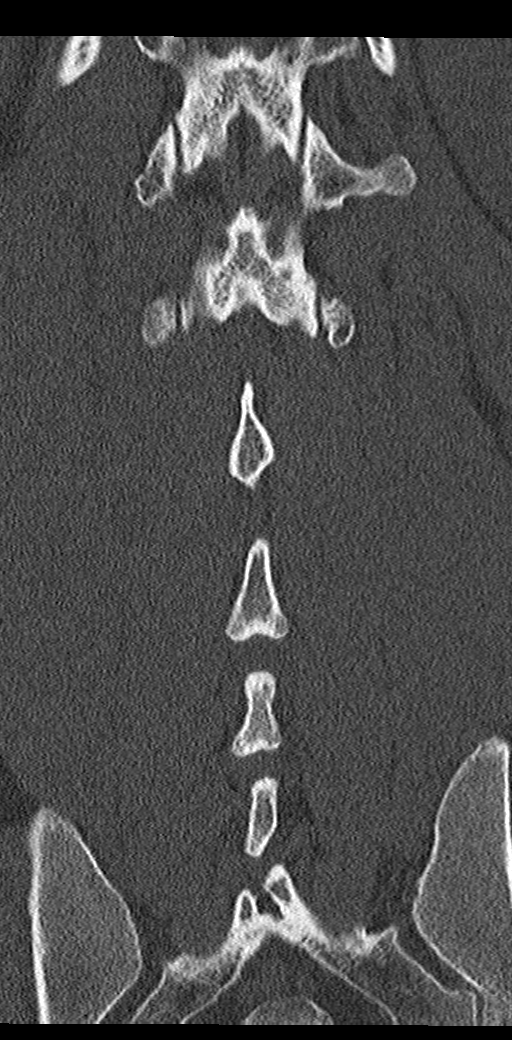
[im 25/61  bone]
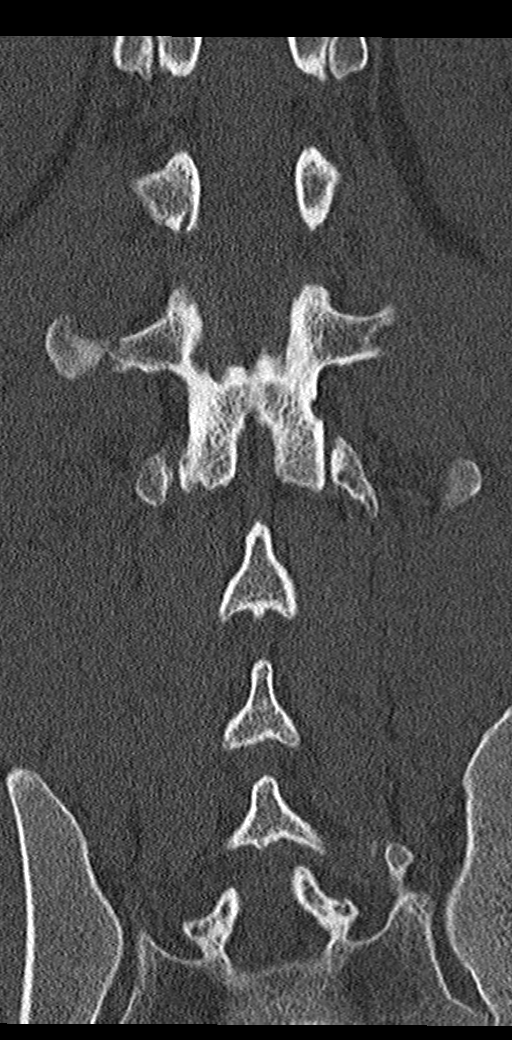
[im 37/61  bone]
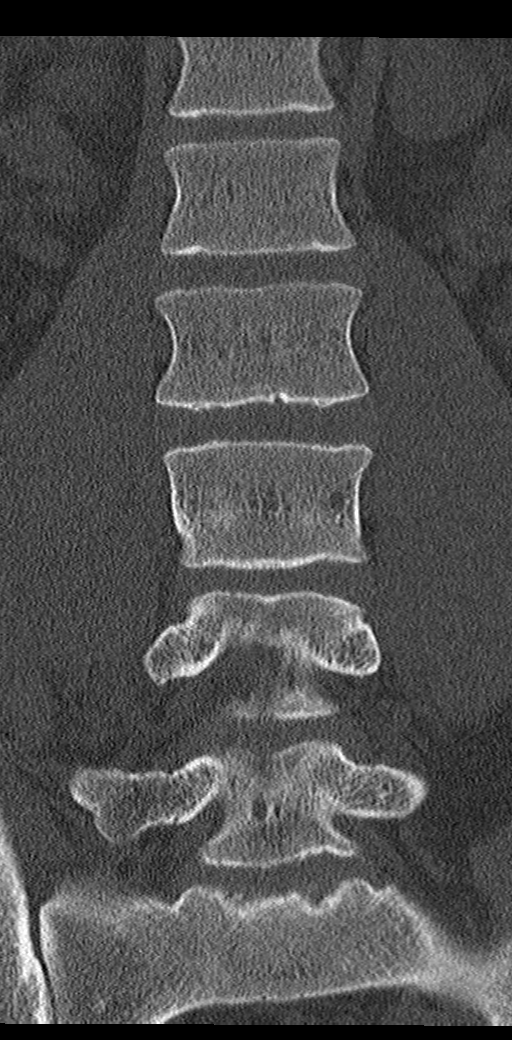

[Series 7: sagittal st · sagittal · 0.27mm/px · 5 of 77 slices shown, 6 images]
[im 26/77  bone]
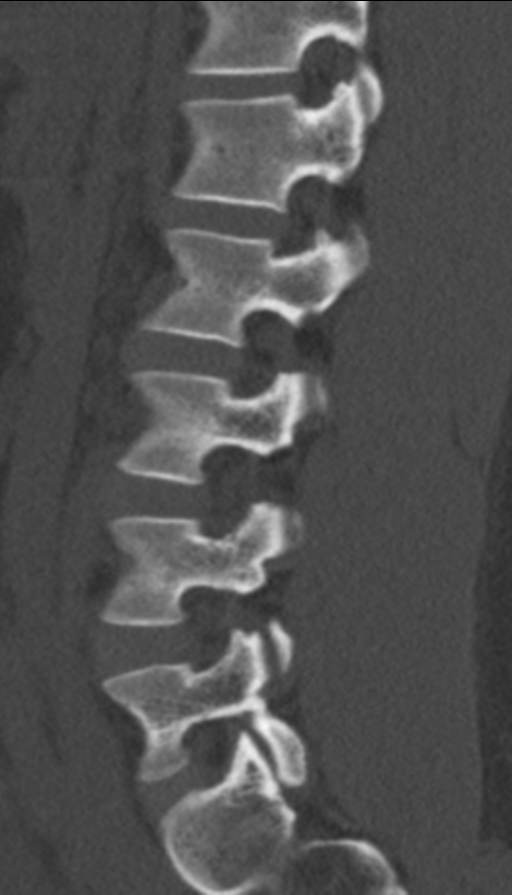
[im 32/77  bone]
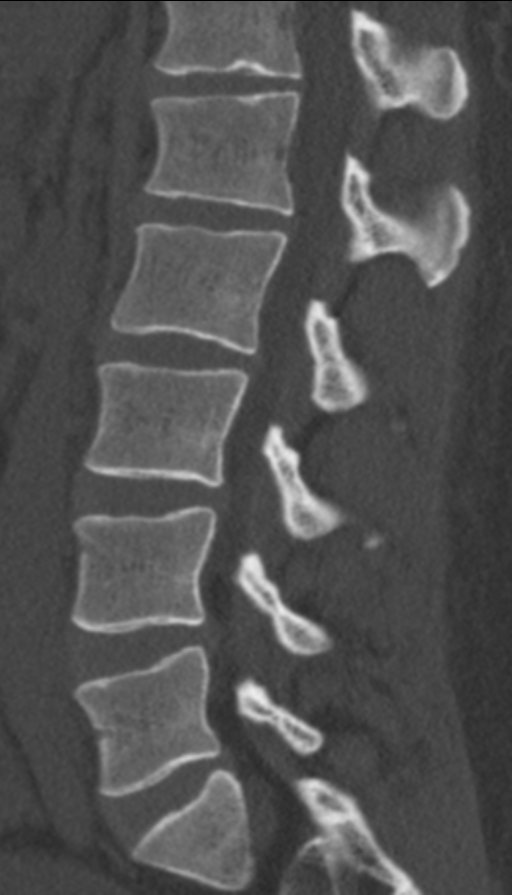
[im 39/77  soft-tissue]
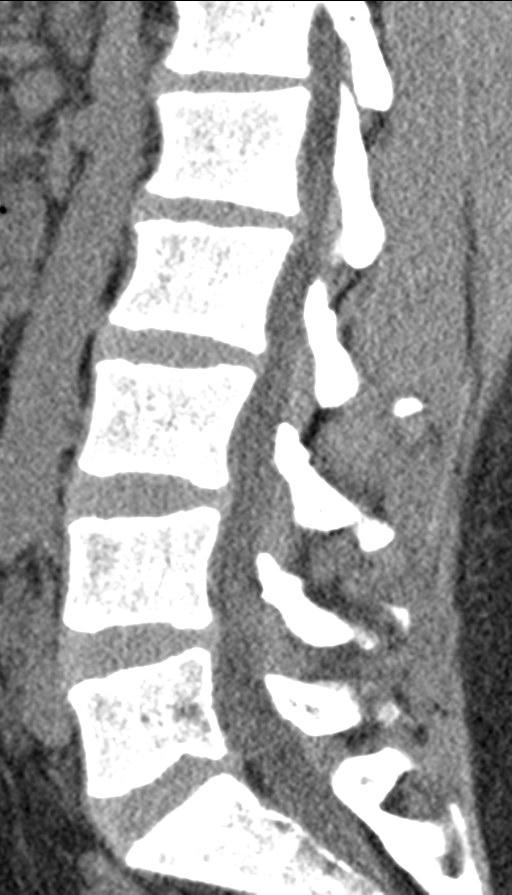
[im 39/77  bone]
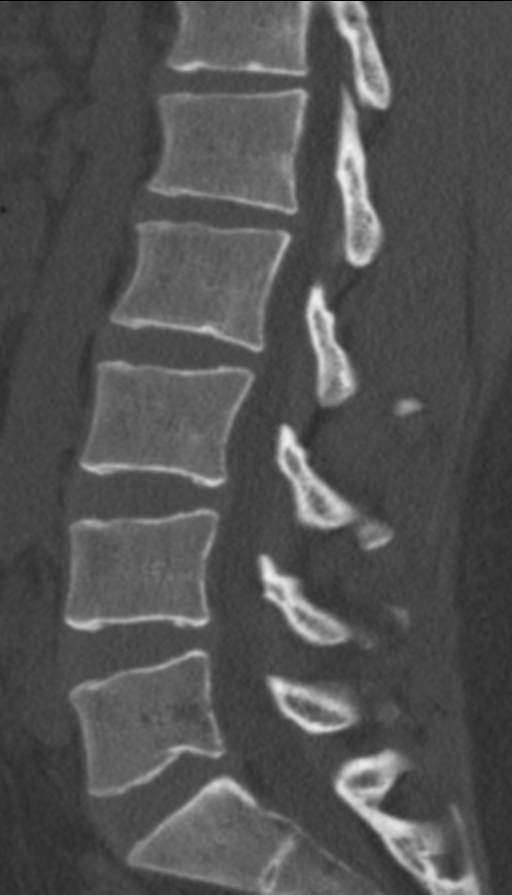
[im 45/77  bone]
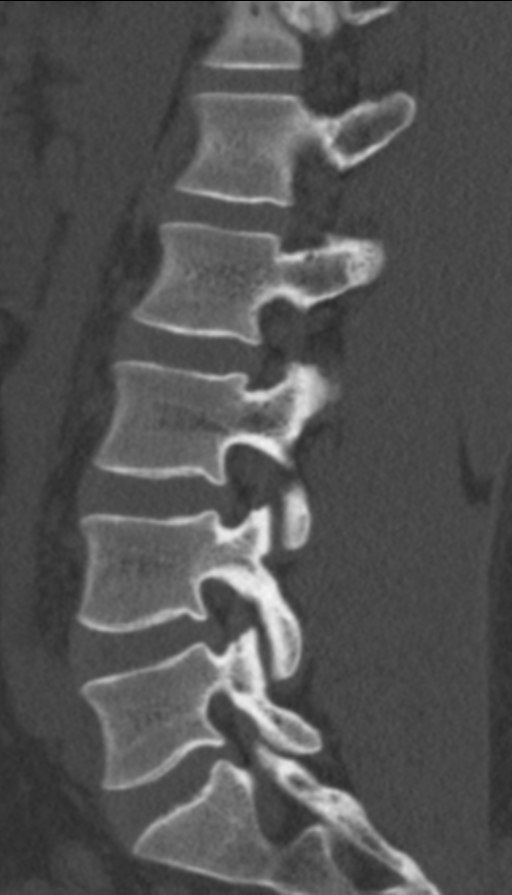
[im 51/77  bone]
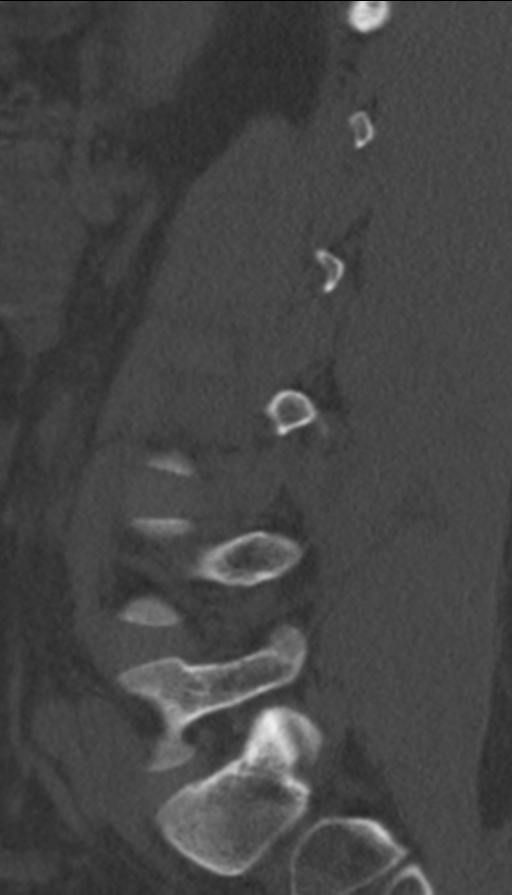

[11 of 33 positions shown; findings below may reference images not displayed]

FINDINGS: Normal lumbar segmentation. Mildly displaced right L2, L3, L4
transverse process fractures. No evidence of vertebral body
fracture. No traumatic malalignment. No visible disc herniation or
stenosis. Negative visualized retroperitoneum.
IMPRESSION: Mildly displaced L2, L3, and L4 right transverse process fractures.

## 2018-03-14 IMAGING — CR DG CHEST 2V
3 series · 3 of 3 positions shown · non-contrast
Comparison: None.

CLINICAL DATA: Pain after trauma

EXAM:
CHEST  2 VIEW

[w chest lat (1 of 2)]
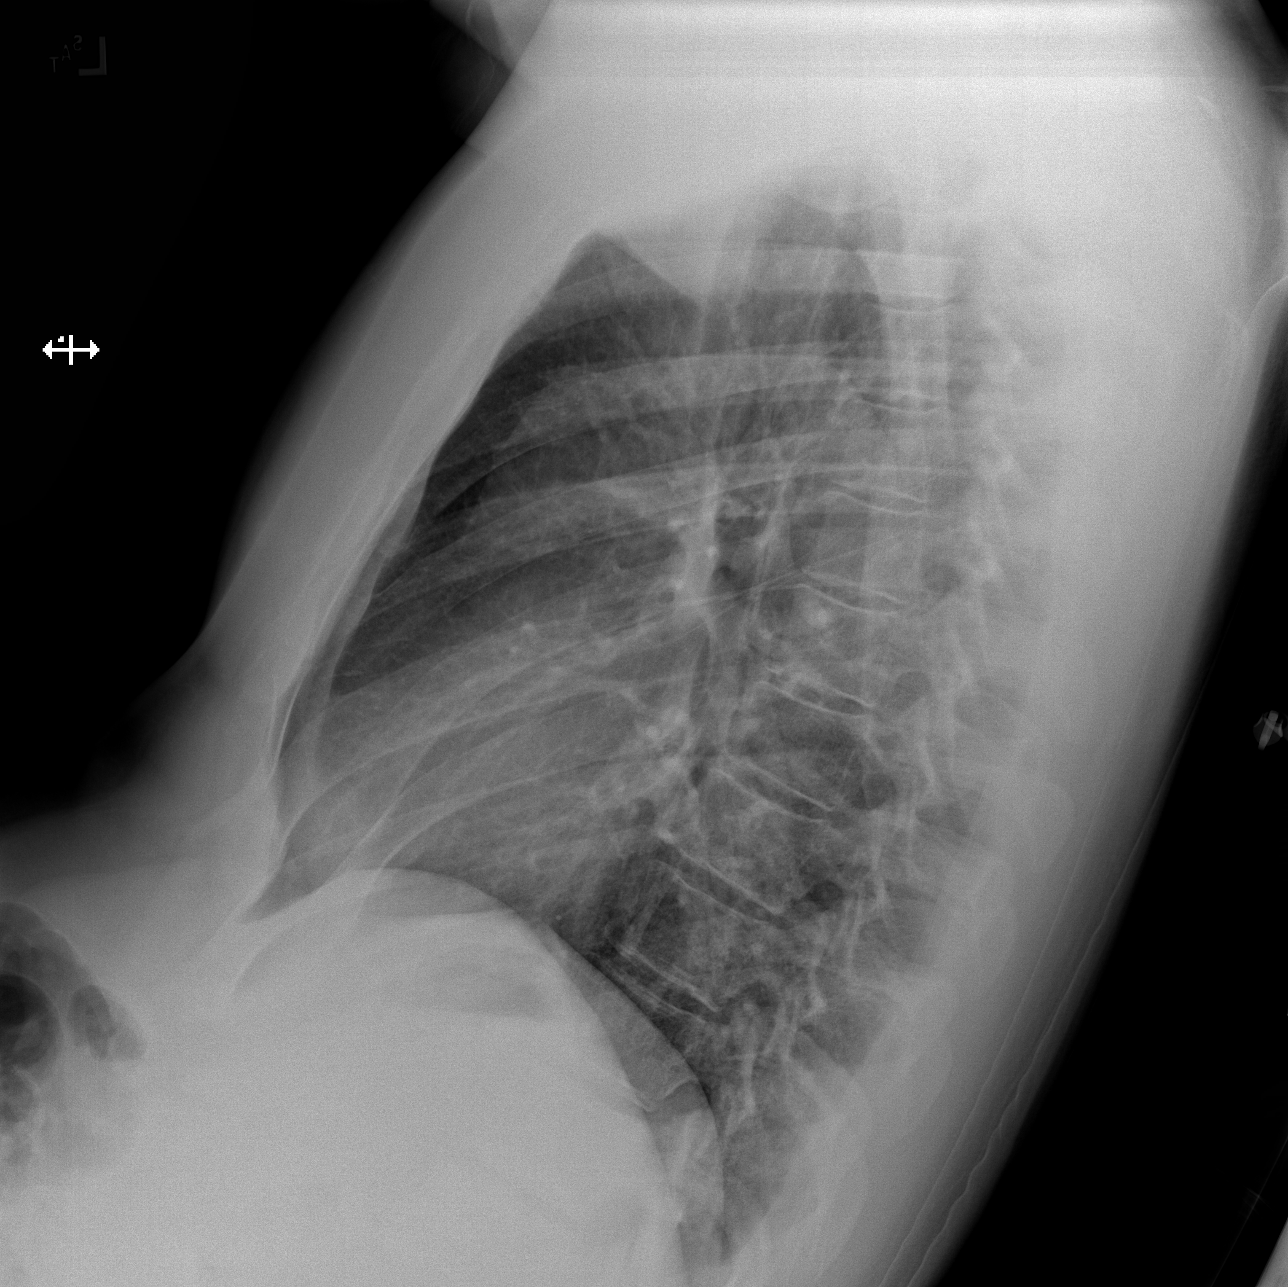

[w chest lat (2 of 2)]
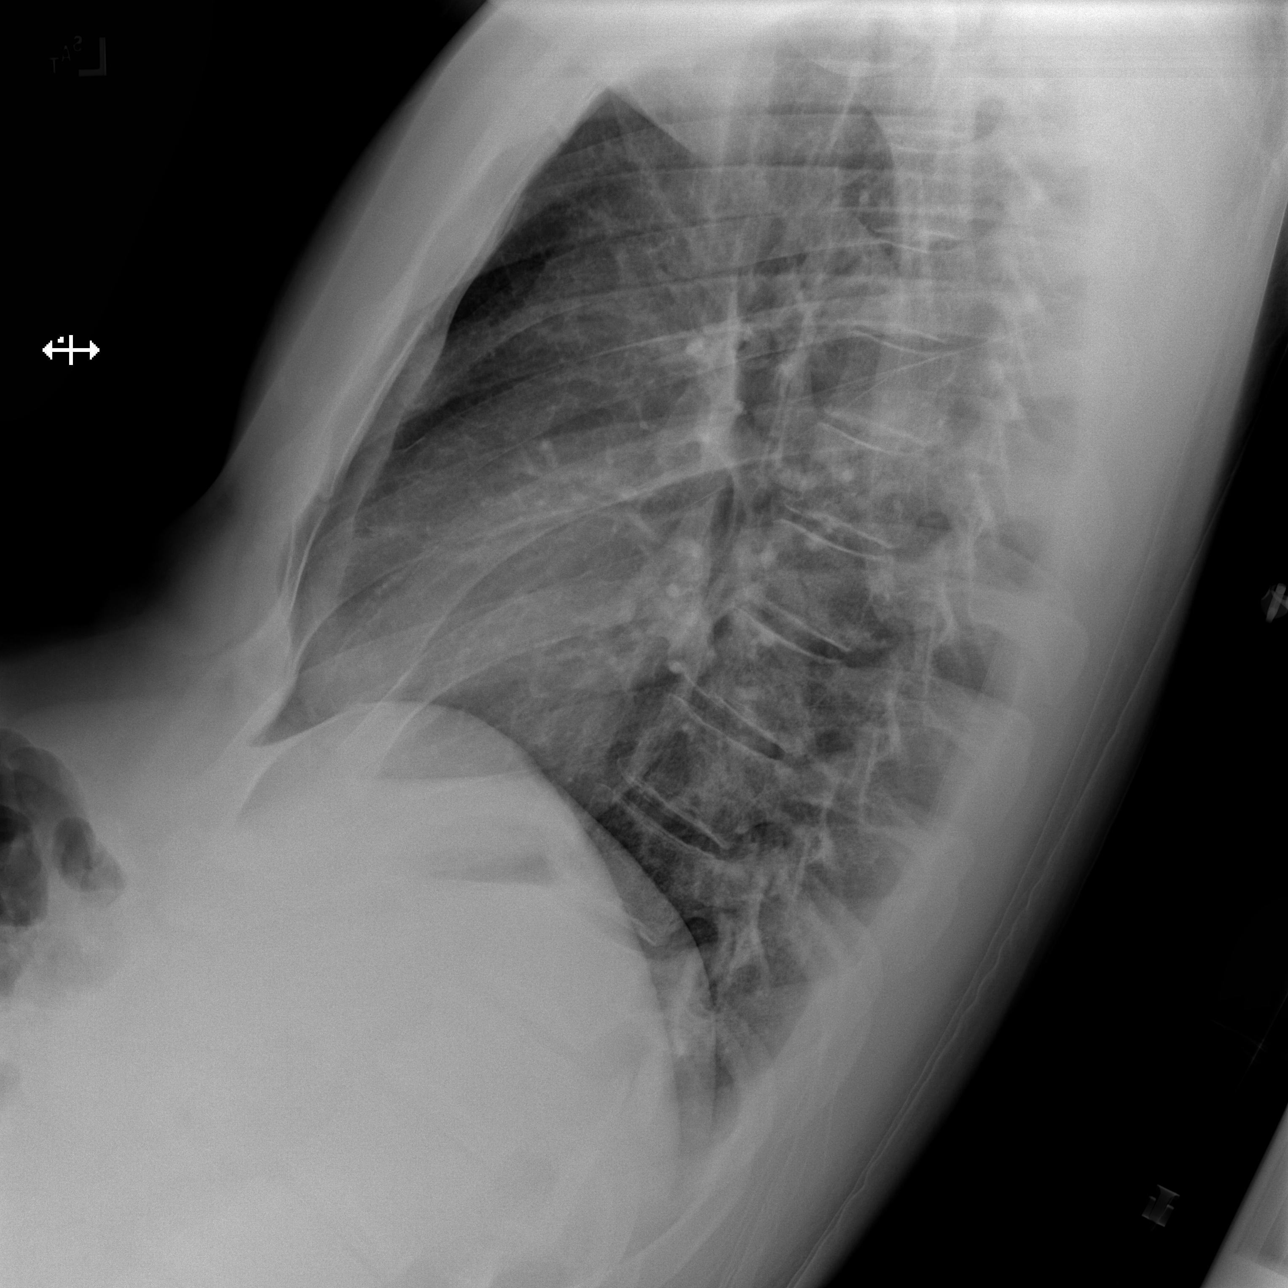

[x chest ap]
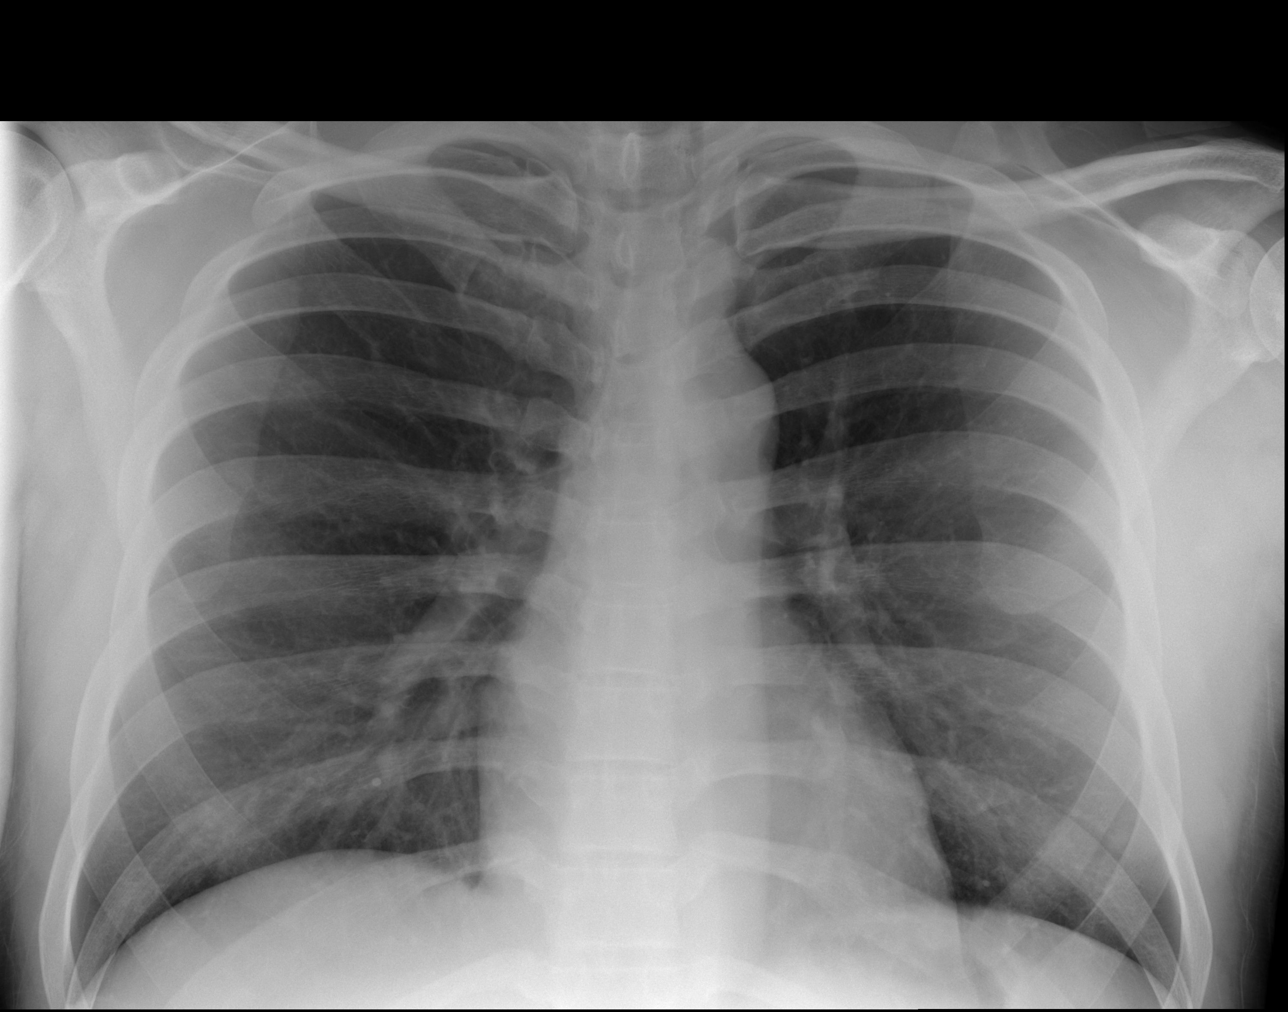

[3 of 3 positions shown; findings below may reference images not displayed]

FINDINGS: The heart size and mediastinal contours are within normal limits.
Both lungs are clear. The visualized skeletal structures are
unremarkable.
IMPRESSION: No active cardiopulmonary disease.

## 2018-08-10 ENCOUNTER — Ambulatory Visit (HOSPITAL_COMMUNITY)
Admission: EM | Admit: 2018-08-10 | Discharge: 2018-08-10 | Disposition: A | Discharge status: Home / Self Care | Payer: Managed Care, Other (non HMO) | Attending: Family Medicine | Admitting: Family Medicine

## 2018-08-10 ENCOUNTER — Encounter (HOSPITAL_COMMUNITY): Payer: Self-pay | Admitting: Emergency Medicine

## 2018-08-10 ENCOUNTER — Other Ambulatory Visit: Payer: Self-pay

## 2018-08-10 DIAGNOSIS — R5383 Other fatigue: Secondary | ICD-10-CM

## 2018-08-10 DIAGNOSIS — R0981 Nasal congestion: Secondary | ICD-10-CM

## 2018-08-10 DIAGNOSIS — R05 Cough: Secondary | ICD-10-CM | POA: Diagnosis not present

## 2018-08-10 DIAGNOSIS — R509 Fever, unspecified: Secondary | ICD-10-CM

## 2018-08-10 DIAGNOSIS — J111 Influenza due to unidentified influenza virus with other respiratory manifestations: Secondary | ICD-10-CM

## 2018-08-10 DIAGNOSIS — R69 Illness, unspecified: Secondary | ICD-10-CM | POA: Insufficient documentation

## 2018-08-10 DIAGNOSIS — Z20828 Contact with and (suspected) exposure to other viral communicable diseases: Secondary | ICD-10-CM

## 2018-08-10 MED ORDER — OSELTAMIVIR PHOSPHATE 75 MG PO CAPS: 75.0000 mg | ORAL_CAPSULE | Freq: Two times a day (BID) | ORAL | 0 refills | Status: DC

## 2018-08-10 NOTE — ED Triage Notes (Signed)
Onset of symptoms on Tuesday.  Patient says his son was tested for flu and is positive.    Chest soreness with cough.  Patient complains of feeling weak in general.  Has been feverish.  Patient reports fever has been 102

## 2018-08-10 NOTE — Discharge Instructions (Signed)
Take Tamiflu 2 times a day for 5 days Take 2 doses today Use Tylenol or ibuprofen for pain and fever Drink plenty of fluids May use over-the-counter cold and flu products Return if you feel worse instead of better

## 2018-08-10 NOTE — ED Provider Notes (Signed)
[facility]    CSN: [id number] Arrival date & time: 08/10/18  0913     History   Chief Complaint Chief Complaint  Patient presents with  . URI    HPI Lawrence David is a 32 y.o. male.   HPI Patient is here for an upper respiratory infection.  His son was diagnosed with influenza earlier in the week.  2 days ago he started having fever to 102, body aches, coughing, chills.  He still very achy and sore.  He tried to go to work yesterday but was unable.  He is here for evaluation.  He has been taking over-the-counter medicine.  He took ibuprofen this morning to try to go to work.  No nausea or vomiting  Past Medical History:  Diagnosis Date  . Asthma     There are no active problems to display for this patient.   Past Surgical History:  Procedure Laterality Date  . ANTERIOR CRUCIATE LIGAMENT REPAIR         Home Medications    Prior to Admission medications   Medication Sig Start Date End Date Taking? Authorizing Provider  ibuprofen (ADVIL,MOTRIN) 200 MG tablet Take 800 mg by mouth every 6 (six) hours as needed for headache or mild pain.   Yes [provider]  NON FORMULARY    Yes [provider]  loratadine (CLARITIN) 10 MG tablet Take 10 mg by mouth daily as needed for allergies.    [provider]  oseltamivir (TAMIFLU) 75 MG capsule Take 1 capsule (75 mg total) by mouth every 12 (twelve) hours. 08/10/18   Eustace MooreNelson, Marranda Arakelian Sue, MD    Family History Family History  Problem Relation Age of Onset  . Hyperlipidemia Father     Social History Social History   Tobacco Use  . Smoking status: Former Smoker    Years: 8.00    Types: Cigars  Substance Use Topics  . Alcohol use: Yes    Comment: social  . Drug use: No     Allergies   Patient has no known allergies.   Review of Systems Review of Systems  Constitutional: Positive for chills, diaphoresis, fatigue and fever.  HENT: Positive for congestion. Negative for  ear pain and sore throat.   Eyes: Negative for pain and visual disturbance.  Respiratory: Positive for cough. Negative for shortness of breath.   Cardiovascular: Negative for chest pain and palpitations.  Gastrointestinal: Negative for abdominal pain and vomiting.  Genitourinary: Negative for dysuria and hematuria.  Musculoskeletal: Positive for myalgias. Negative for arthralgias and back pain.  Skin: Negative for color change and rash.  Neurological: Negative for seizures and syncope.  All other systems reviewed and are negative.    Physical Exam Triage Vital Signs ED Triage Vitals  Enc Vitals Group     BP 08/10/18 0948 (!) 142/92     Pulse Rate 08/10/18 0948 87     Resp 08/10/18 0948 18     Temp 08/10/18 0948 98.1 F (36.7 C)     Temp Source 08/10/18 0948 Oral     SpO2 08/10/18 0948 96 %     Weight --      Height --      Head Circumference --      Peak Flow --      Pain Score 08/10/18 0944 5     Pain Loc --      Pain Edu? --      Excl. in [place]? --  No data found.  Updated Vital Signs BP (!) 142/92 (BP Location: Left Arm) Comment: large cuff  Pulse 87   Temp 98.1 F (36.7 C) (Oral)   Resp 18   SpO2 96%      Physical Exam Constitutional:      General: He is not in acute distress.    Appearance: He is well-developed. He is ill-appearing.  HENT:     Head: Normocephalic and atraumatic.     Right Ear: Tympanic membrane and ear canal normal.     Left Ear: Tympanic membrane and ear canal normal.     Nose: Congestion present.     Mouth/Throat:     Mouth: Mucous membranes are moist.  Eyes:     Conjunctiva/sclera: Conjunctivae normal.     Pupils: Pupils are equal, round, and reactive to light.  Neck:     Musculoskeletal: Normal range of motion.  Cardiovascular:     Rate and Rhythm: Normal rate and regular rhythm.     Pulses: Normal pulses.     Heart sounds: Normal heart sounds.  Pulmonary:     Effort: Pulmonary effort is normal. No respiratory distress.      Breath sounds: Normal breath sounds.  Abdominal:     General: Abdomen is flat. There is no distension.     Palpations: Abdomen is soft.     Tenderness: There is no abdominal tenderness.     Comments: No HSM  Musculoskeletal: Normal range of motion.  Skin:    General: Skin is warm and dry.  Neurological:     General: No focal deficit present.     Mental Status: He is alert. Mental status is at baseline.  Psychiatric:        Mood and Affect: Mood normal.        Thought Content: Thought content normal.      UC Treatments / Results  Labs (all labs ordered are listed, but only abnormal results are displayed) Labs Reviewed - No data to display  EKG None  Radiology No results found.  Procedures Procedures (including critical care time)  Medications Ordered in UC Medications - No data to display  Initial Impression / Assessment and Plan / UC Course  I have reviewed the triage vital signs and the nursing notes.  Pertinent labs & imaging results that were available during my care of the patient were reviewed by me and considered in my medical decision making (see chart for details).     With influenza exposure and influenza-like symptoms, I am going to treat him with Tamiflu.  We are at the 48-hour limit for effectiveness and this is discussed with him.  He wishes to take the medication.  Risks and benefits discussed.  Watch out for nausea.  Return if worse Final Clinical Impressions(s) / UC Diagnoses   Final diagnoses:  Influenza-like illness     Discharge Instructions     Take Tamiflu 2 times a day for 5 days Take 2 doses today Use Tylenol or ibuprofen for pain and fever Drink plenty of fluids May use over-the-counter cold and flu products Return if you feel worse instead of better   ED Prescriptions    Medication Sig Dispense Auth. Provider   oseltamivir (TAMIFLU) 75 MG capsule Take 1 capsule (75 mg total) by mouth every 12 (twelve) hours. 10 capsule Eustace MooreNelson,  Pegge Cumberledge Sue, MD     Controlled Substance Prescriptions [place] Controlled Substance Registry consulted? Not Applicable   Eustace MooreNelson, Yunior Jain Sue, MD 08/10/18 1011

## 2019-05-15 ENCOUNTER — Other Ambulatory Visit: Payer: Self-pay

## 2019-05-15 ENCOUNTER — Ambulatory Visit: Admission: EM | Admit: 2019-05-15 | Discharge: 2019-05-15 | Payer: Managed Care, Other (non HMO)

## 2019-06-14 ENCOUNTER — Ambulatory Visit
Admission: EM | Admit: 2019-06-14 | Discharge: 2019-06-14 | Disposition: A | Discharge status: Home / Self Care | Payer: No Typology Code available for payment source | Attending: Physician Assistant | Admitting: Physician Assistant

## 2019-06-14 ENCOUNTER — Encounter: Payer: Self-pay | Admitting: Emergency Medicine

## 2019-06-14 ENCOUNTER — Other Ambulatory Visit: Payer: Self-pay

## 2019-06-14 DIAGNOSIS — R05 Cough: Secondary | ICD-10-CM | POA: Diagnosis not present

## 2019-06-14 DIAGNOSIS — Z1159 Encounter for screening for other viral diseases: Secondary | ICD-10-CM | POA: Diagnosis not present

## 2019-06-14 DIAGNOSIS — R059 Cough, unspecified: Secondary | ICD-10-CM

## 2019-06-14 MED ORDER — BENZONATATE 200 MG PO CAPS: 200.0000 mg | ORAL_CAPSULE | Freq: Three times a day (TID) | ORAL | 0 refills | Status: DC

## 2019-06-14 MED ORDER — FLUTICASONE PROPIONATE 50 MCG/ACT NA SUSP: 2.0000 | Freq: Every day | NASAL | 0 refills | Status: DC

## 2019-06-14 NOTE — ED Triage Notes (Signed)
Pt presents to [facility] for assessment of 3 days of nasal congestion.  States he began to develop SOB last night, had to use his albuterol inhaler (hx of asthma) without much relief.  NAD at triage.  Patient c/o developing cough over night.

## 2019-06-14 NOTE — ED Notes (Signed)
Patient able to ambulate independently  

## 2019-06-14 NOTE — ED Provider Notes (Signed)
[facility]    CSN: [id number] Arrival date & time: 06/14/19  1046      History   Chief Complaint Chief Complaint  Patient presents with  . Cough    HPI Lawrence David is a 33 y.o. male.   33 year old male comes in for 3 day history nasal congestion, cough. Has had shortness of breath last night and used albuterol without known relief. He states just feel like its not his normal breathing. Denies fever, chills. States has body aches when coughing hard. Had post tussive emesis, otherwise, denies abdominal pain, nausea, diarrhea. Denies loss of taste/smell. Former social smoker. Working at [employer], about 6 ft away from coworkers, wear mask.        Past Medical History:  Diagnosis Date  . Asthma     There are no active problems to display for this patient.   Past Surgical History:  Procedure Laterality Date  . ANTERIOR CRUCIATE LIGAMENT REPAIR         Home Medications    Prior to Admission medications   Medication Sig Start Date End Date Taking? Authorizing Provider  benzonatate (TESSALON) 200 MG capsule Take 1 capsule (200 mg total) by mouth every 8 (eight) hours. 06/14/19   Tasia Catchings, Bette Brienza V, PA-C  fluticasone (FLONASE) 50 MCG/ACT nasal spray Place 2 sprays into both nostrils daily. 06/14/19   Tasia Catchings, Mareena Cavan V, PA-C  ibuprofen (ADVIL,MOTRIN) 200 MG tablet Take 800 mg by mouth every 6 (six) hours as needed for headache or mild pain.    [provider]  loratadine (CLARITIN) 10 MG tablet Take 10 mg by mouth daily as needed for allergies.    [provider]  NON FORMULARY     [provider]    Family History Family History  Problem Relation Age of Onset  . Hyperlipidemia Father     Social History Social History   Tobacco Use  . Smoking status: Former Smoker    Years: 8.00    Types: Cigars  . Smokeless tobacco: Never Used  Substance Use Topics  . Alcohol use: Yes    Comment: social  . Drug use: No     Allergies    Patient has no known allergies.   Review of Systems Review of Systems  Reason unable to perform ROS: See HPI as above.     Physical Exam Triage Vital Signs ED Triage Vitals  Enc Vitals Group     BP 06/14/19 1109 (!) 147/96     Pulse Rate 06/14/19 1109 65     Resp 06/14/19 1109 18     Temp 06/14/19 1109 98.6 F (37 C)     Temp Source 06/14/19 1109 Temporal     SpO2 06/14/19 1109 94 %     Weight --      Height --      Head Circumference --      Peak Flow --      Pain Score 06/14/19 1110 0     Pain Loc --      Pain Edu? --      Excl. in [place]? --    No data found.  Updated Vital Signs BP (!) 147/96 (BP Location: Right Arm)   Pulse 65   Temp 98.6 F (37 C) (Temporal)   Resp 18   SpO2 94%   Physical Exam Constitutional:      General: He is not in acute distress.    Appearance: Normal appearance. He is not  ill-appearing, toxic-appearing or diaphoretic.  HENT:     Head: Normocephalic and atraumatic.     Mouth/Throat:     Mouth: Mucous membranes are moist.     Pharynx: Oropharynx is clear. Uvula midline.  Neck:     Musculoskeletal: Normal range of motion and neck supple.  Cardiovascular:     Rate and Rhythm: Normal rate and regular rhythm.     Heart sounds: Normal heart sounds. No murmur. No friction rub. No gallop.   Pulmonary:     Effort: Pulmonary effort is normal. No accessory muscle usage, prolonged expiration, respiratory distress or retractions.     Comments: Speaking in full sentences without difficulty. Lungs clear to auscultation without adventitious lung sounds. Skin:    General: Skin is warm and dry.  Neurological:     General: No focal deficit present.     Mental Status: He is alert and oriented to person, place, and time.      UC Treatments / Results  Labs (all labs ordered are listed, but only abnormal results are displayed) Labs Reviewed  NOVEL CORONAVIRUS, NAA    EKG   Radiology No results found.  Procedures Procedures (including  critical care time)  Medications Ordered in UC Medications - No data to display  Initial Impression / Assessment and Plan / UC Course  I have reviewed the triage vital signs and the nursing notes.  Pertinent labs & imaging results that were available during my care of the patient were reviewed by me and considered in my medical decision making (see chart for details).    No alarming signs on exam.  Patient speaking in full sentences without respiratory distress.  COVID testing ordered.  Patient to quarantine until testing results return.  Symptomatic treatment discussed.  Push fluids.  Return precautions given.  Patient expresses understanding and agrees to plan.  Final Clinical Impressions(s) / UC Diagnoses   Final diagnoses:  Cough   ED Prescriptions    Medication Sig Dispense Auth. Provider   benzonatate (TESSALON) 200 MG capsule Take 1 capsule (200 mg total) by mouth every 8 (eight) hours. 21 capsule Lissete Maestas V, PA-C   fluticasone (FLONASE) 50 MCG/ACT nasal spray Place 2 sprays into both nostrils daily. 1 g Belinda Fisher, PA-C     PDMP not reviewed this encounter.   Belinda Fisher, PA-C 06/14/19 1137

## 2019-06-14 NOTE — Discharge Instructions (Addendum)
As discussed, cannot rule out COVID. Currently, no alarming signs. Testing ordered. I would like you to quarantine until testing results. Tessalon for cough. Flonase for nasal congestion. If experiencing worsening shortness of breath, trouble breathing, go to the ED for further evaluation needed.

## 2019-06-17 LAB — NOVEL CORONAVIRUS, NAA: SARS-CoV-2, NAA: NOT DETECTED

## 2019-11-29 ENCOUNTER — Ambulatory Visit: Payer: No Typology Code available for payment source | Attending: Internal Medicine

## 2019-11-29 DIAGNOSIS — Z23 Encounter for immunization: Secondary | ICD-10-CM

## 2019-11-29 NOTE — Progress Notes (Signed)
   Covid-19 Vaccination Clinic  Name:  Lawrence David    MRN: [id number] DOB: 1986-04-17  11/29/2019  [name] was observed post Covid-19 immunization for 15 minutes without incident. He was provided with Vaccine Information Sheet and instruction to access the V-Safe system.   [name] was instructed to call 911 with any severe reactions post vaccine: Marland Kitchen Difficulty breathing  . Swelling of face and throat  . A fast heartbeat  . A bad rash all over body  . Dizziness and weakness   Immunizations Administered    Name Date Dose VIS Date Route   Pfizer COVID-19 Vaccine 11/29/2019  8:54 AM 0.3 mL 08/03/2019 Intramuscular   Manufacturer: [employer], [employer]   Lot: [id number]   NDC: [address]-1

## 2019-12-17 DIAGNOSIS — F17290 Nicotine dependence, other tobacco product, uncomplicated: Secondary | ICD-10-CM | POA: Diagnosis not present

## 2019-12-17 DIAGNOSIS — Y929 Unspecified place or not applicable: Secondary | ICD-10-CM | POA: Insufficient documentation

## 2019-12-17 DIAGNOSIS — W298XXA Contact with other powered powered hand tools and household machinery, initial encounter: Secondary | ICD-10-CM | POA: Diagnosis not present

## 2019-12-17 DIAGNOSIS — Z79899 Other long term (current) drug therapy: Secondary | ICD-10-CM | POA: Diagnosis not present

## 2019-12-17 DIAGNOSIS — J45909 Unspecified asthma, uncomplicated: Secondary | ICD-10-CM | POA: Insufficient documentation

## 2019-12-17 DIAGNOSIS — Y9389 Activity, other specified: Secondary | ICD-10-CM | POA: Diagnosis not present

## 2019-12-17 DIAGNOSIS — S61217A Laceration without foreign body of left little finger without damage to nail, initial encounter: Secondary | ICD-10-CM | POA: Diagnosis not present

## 2019-12-17 DIAGNOSIS — Y999 Unspecified external cause status: Secondary | ICD-10-CM | POA: Diagnosis not present

## 2019-12-18 ENCOUNTER — Encounter: Payer: Self-pay | Admitting: Emergency Medicine

## 2019-12-18 ENCOUNTER — Emergency Department
Admission: EM | Admit: 2019-12-18 | Discharge: 2019-12-18 | Disposition: A | Discharge status: Home / Self Care | Payer: Worker's Compensation | Attending: Emergency Medicine | Admitting: Emergency Medicine

## 2019-12-18 ENCOUNTER — Other Ambulatory Visit: Payer: Self-pay

## 2019-12-18 DIAGNOSIS — S61217A Laceration without foreign body of left little finger without damage to nail, initial encounter: Secondary | ICD-10-CM

## 2019-12-18 NOTE — ED Provider Notes (Signed)
[facility] Emergency Department Provider Note  ____________________________________________  Time seen: Approximately 5:57 AM  I have reviewed the triage vital signs and the nursing notes.   HISTORY  Chief Complaint Laceration   HPI Lawrence David is a 34 y.o. male presents for laceration of his finger.  Patient was at work trying to fix a broken drill when he cut his finger onto the drill.  He is right-handed.  The laceration is located on the palmar aspect of the left fifth digit.  He is complaining of mild throbbing pain since it had been which was prior to arrival.  Tetanus shot is up-to-date. He is not a diabetic.  Patient reports washing the wound up with an peroxide on it when it happened.  Past Medical History:  Diagnosis Date  . Asthma     There are no problems to display for this patient.   Past Surgical History:  Procedure Laterality Date  . ANTERIOR CRUCIATE LIGAMENT REPAIR      Prior to Admission medications   Medication Sig Start Date End Date Taking? Authorizing Provider  benzonatate (TESSALON) 200 MG capsule Take 1 capsule (200 mg total) by mouth every 8 (eight) hours. 06/14/19   Cathie Hoops, Amy V, PA-C  fluticasone (FLONASE) 50 MCG/ACT nasal spray Place 2 sprays into both nostrils daily. 06/14/19   Cathie Hoops, Amy V, PA-C  ibuprofen (ADVIL,MOTRIN) 200 MG tablet Take 800 mg by mouth every 6 (six) hours as needed for headache or mild pain.    [provider]  loratadine (CLARITIN) 10 MG tablet Take 10 mg by mouth daily as needed for allergies.    [provider]  NON FORMULARY     [provider]    Allergies Patient has no known allergies.  Family History  Problem Relation Age of Onset  . Hyperlipidemia Father     Social History Social History   Tobacco Use  . Smoking status: Current Every Day Smoker    Years: 8.00    Types: Cigars  . Smokeless tobacco: Never Used  Substance Use Topics  . Alcohol use:  Yes    Comment: social  . Drug use: No    Review of Systems  Constitutional: Negative for fever. Eyes: Negative for visual changes. ENT: Negative for sore throat. Neck: No neck pain  Cardiovascular: Negative for chest pain. Respiratory: Negative for shortness of breath. Gastrointestinal: Negative for abdominal pain, vomiting or diarrhea. Genitourinary: Negative for dysuria. Musculoskeletal: Negative for back pain. Skin: Negative for rash. + L finger laceration Neurological: Negative for headaches, weakness or numbness. Psych: No SI or HI  ____________________________________________   PHYSICAL EXAM:  VITAL SIGNS: ED Triage Vitals  Enc Vitals Group     BP 12/17/19 2344 (!) 146/93     Pulse Rate 12/17/19 2344 79     Resp 12/17/19 2344 18     Temp 12/17/19 2344 (!) 96.5 F (35.8 C)     Temp Source 12/17/19 2344 Oral     SpO2 12/17/19 2344 98 %     Weight 12/17/19 2344 265 lb (120.2 kg)     Height 12/17/19 2344 6\' 3"  (1.905 m)     Head Circumference --      Peak Flow --      Pain Score 12/18/19 0004 2     Pain Loc --      Pain Edu? --      Excl. in GC? --     Constitutional: Alert and oriented. Well  appearing and in no apparent distress. HEENT:      Head: Normocephalic and atraumatic.         Eyes: Conjunctivae are normal. Sclera is non-icteric.       Mouth/Throat: Mucous membranes are moist.       Neck: Supple with no signs of meningismus. Cardiovascular: Regular rate and rhythm.  Respiratory: Normal respiratory effort.  Musculoskeletal: Very superficial 1 cm laceration of the distal pad of the left little finger with no tendon involvement, bleeding is well controlled.   Neurologic: Normal speech and language. Face is symmetric. Moving all extremities. No gross focal neurologic deficits are appreciated. Skin: Skin is warm, dry and intact. No rash noted. Psychiatric: Mood and affect are normal. Speech and behavior are  normal.  ____________________________________________   LABS (all labs ordered are listed, but only abnormal results are displayed)  Labs Reviewed - No data to display ____________________________________________  EKG  none  ____________________________________________  RADIOLOGY  none  ____________________________________________   PROCEDURES  Procedure(s) performed:yes .Marland KitchenLaceration Repair  Date/Time: 12/18/2019 5:59 AM Performed by: Rudene Re, MD Authorized by: Rudene Re, MD   Consent:    Consent obtained:  Verbal   Consent given by:  Patient   Risks discussed:  Infection, pain, retained foreign body, poor cosmetic result and poor wound healing Anesthesia (see MAR for exact dosages):    Anesthesia method:  None Laceration details:    Location:  Finger   Finger location:  L small finger   Length (cm):  1 Repair type:    Repair type:  Simple Exploration:    Hemostasis achieved with:  Direct pressure   Wound exploration: entire depth of wound probed and visualized     Wound extent: no foreign bodies/material noted, no tendon damage noted, no underlying fracture noted and no vascular damage noted     Contaminated: no   Treatment:    Area cleansed with:  Betadine   Amount of cleaning:  Extensive   Irrigation solution:  Sterile saline   Visualized foreign bodies/material removed: no   Skin repair:    Repair method:  Steri-Strips and tissue adhesive Approximation:    Approximation:  Close Post-procedure details:    Dressing:  Sterile dressing   Patient tolerance of procedure:  Tolerated well, no immediate complications   Critical Care performed:  None ____________________________________________   INITIAL IMPRESSION / ASSESSMENT AND PLAN / ED COURSE   34 y.o. male presents for laceration of his finger.  Patient with a very shallow laceration of the pad of the fifth digit on the left which was repaired per procedure note above.  Tetanus  shot is up-to-date.  Discussed wound care and return precautions for any signs of infection.  Old medical records reviewed.    _____________________________________________ Please note:  Patient was evaluated in Emergency Department today for the symptoms described in the history of present illness. Patient was evaluated in the context of the global COVID-19 pandemic, which necessitated consideration that the patient might be at risk for infection with the SARS-CoV-2 virus that causes COVID-19. Institutional protocols and algorithms that pertain to the evaluation of patients at risk for COVID-19 are in a state of rapid change based on information released by regulatory bodies including the CDC and federal and state organizations. These policies and algorithms were followed during the patient's care in the ED.  Some ED evaluations and interventions may be delayed as a result of limited staffing during the pandemic.   [place] Controlled Substance Database was reviewed  by me. ____________________________________________   FINAL CLINICAL IMPRESSION(S) / ED DIAGNOSES   Final diagnoses:  Laceration of left little finger without foreign body without damage to nail, initial encounter      NEW MEDICATIONS STARTED DURING THIS VISIT:  ED Discharge Orders    None       Note:  This document was prepared using Dragon voice recognition software and may include unintentional dictation errors.    Don Perking, [place], MD 12/18/19 0600

## 2019-12-18 NOTE — ED Triage Notes (Addendum)
Pt presents to ED with laceration to his left 5th digit while he was work. Pt states he was trying to replace a broken drill and sliced his finger on the sharp broken edge of the drill. Bleeding slightly. Small laceration to pad of finger noted.

## 2019-12-18 NOTE — Discharge Instructions (Addendum)
Keep the dressing in place for the next 24 -48 hours. After that, keep wound dry and clean, wash with warm water and soap. Watch for signs of infection including redness of the skin, if the skin around the current feels hot to the touch, if you notice pus, or fever.  If these develop return to the emergency room for antibiotics.

## 2019-12-24 ENCOUNTER — Ambulatory Visit: Payer: No Typology Code available for payment source | Attending: Internal Medicine

## 2019-12-24 DIAGNOSIS — Z23 Encounter for immunization: Secondary | ICD-10-CM

## 2019-12-24 NOTE — Progress Notes (Signed)
   Covid-19 Vaccination Clinic  Name:  Lawrence David    MRN: [id number] DOB: 1986/02/16  12/24/2019  Mr. Lawrence David was observed post Covid-19 immunization for 15 minutes without incident. He was provided with Vaccine Information Sheet and instruction to access the V-Safe system.   Mr. Lawrence David was instructed to call 911 with any severe reactions post vaccine: Marland Kitchen Difficulty breathing  . Swelling of face and throat  . A fast heartbeat  . A bad rash all over body  . Dizziness and weakness   Immunizations Administered    Name Date Dose VIS Date Route   Pfizer COVID-19 Vaccine 12/24/2019  8:34 AM 0.3 mL 10/17/2018 Intramuscular   Manufacturer: [employer], [employer]   Lot: [id number]   NDC: [address]-5

## 2021-07-24 DIAGNOSIS — F17290 Nicotine dependence, other tobacco product, uncomplicated: Secondary | ICD-10-CM | POA: Diagnosis not present

## 2021-07-24 DIAGNOSIS — R7303 Prediabetes: Secondary | ICD-10-CM | POA: Diagnosis not present

## 2021-07-24 DIAGNOSIS — E669 Obesity, unspecified: Secondary | ICD-10-CM | POA: Diagnosis not present

## 2021-07-24 DIAGNOSIS — Z113 Encounter for screening for infections with a predominantly sexual mode of transmission: Secondary | ICD-10-CM | POA: Diagnosis not present

## 2021-07-24 DIAGNOSIS — R030 Elevated blood-pressure reading, without diagnosis of hypertension: Secondary | ICD-10-CM | POA: Diagnosis not present

## 2021-08-05 DIAGNOSIS — G4733 Obstructive sleep apnea (adult) (pediatric): Secondary | ICD-10-CM | POA: Diagnosis not present

## 2021-08-21 DIAGNOSIS — R030 Elevated blood-pressure reading, without diagnosis of hypertension: Secondary | ICD-10-CM | POA: Diagnosis not present

## 2021-08-21 DIAGNOSIS — G4733 Obstructive sleep apnea (adult) (pediatric): Secondary | ICD-10-CM | POA: Diagnosis not present

## 2021-08-27 DIAGNOSIS — G4733 Obstructive sleep apnea (adult) (pediatric): Secondary | ICD-10-CM | POA: Diagnosis not present

## 2021-09-27 DIAGNOSIS — G4733 Obstructive sleep apnea (adult) (pediatric): Secondary | ICD-10-CM | POA: Diagnosis not present

## 2021-10-23 DIAGNOSIS — G4733 Obstructive sleep apnea (adult) (pediatric): Secondary | ICD-10-CM | POA: Diagnosis not present

## 2021-10-23 DIAGNOSIS — R030 Elevated blood-pressure reading, without diagnosis of hypertension: Secondary | ICD-10-CM | POA: Diagnosis not present

## 2021-10-25 DIAGNOSIS — G4733 Obstructive sleep apnea (adult) (pediatric): Secondary | ICD-10-CM | POA: Diagnosis not present

## 2021-11-25 DIAGNOSIS — G4733 Obstructive sleep apnea (adult) (pediatric): Secondary | ICD-10-CM | POA: Diagnosis not present

## 2021-12-23 DIAGNOSIS — G4733 Obstructive sleep apnea (adult) (pediatric): Secondary | ICD-10-CM | POA: Diagnosis not present

## 2021-12-25 DIAGNOSIS — G4733 Obstructive sleep apnea (adult) (pediatric): Secondary | ICD-10-CM | POA: Diagnosis not present

## 2022-01-25 DIAGNOSIS — G4733 Obstructive sleep apnea (adult) (pediatric): Secondary | ICD-10-CM | POA: Diagnosis not present

## 2022-02-24 DIAGNOSIS — G4733 Obstructive sleep apnea (adult) (pediatric): Secondary | ICD-10-CM | POA: Diagnosis not present

## 2022-03-27 DIAGNOSIS — G4733 Obstructive sleep apnea (adult) (pediatric): Secondary | ICD-10-CM | POA: Diagnosis not present

## 2022-04-16 DIAGNOSIS — L209 Atopic dermatitis, unspecified: Secondary | ICD-10-CM | POA: Diagnosis not present

## 2022-04-27 DIAGNOSIS — G4733 Obstructive sleep apnea (adult) (pediatric): Secondary | ICD-10-CM | POA: Diagnosis not present

## 2022-05-27 DIAGNOSIS — G4733 Obstructive sleep apnea (adult) (pediatric): Secondary | ICD-10-CM | POA: Diagnosis not present

## 2022-11-04 DIAGNOSIS — R509 Fever, unspecified: Secondary | ICD-10-CM | POA: Diagnosis not present

## 2022-11-04 DIAGNOSIS — J029 Acute pharyngitis, unspecified: Secondary | ICD-10-CM | POA: Diagnosis not present

## 2022-11-04 DIAGNOSIS — Z03818 Encounter for observation for suspected exposure to other biological agents ruled out: Secondary | ICD-10-CM | POA: Diagnosis not present

## 2022-12-23 DIAGNOSIS — J029 Acute pharyngitis, unspecified: Secondary | ICD-10-CM | POA: Diagnosis not present

## 2023-04-15 DIAGNOSIS — Z0000 Encounter for general adult medical examination without abnormal findings: Secondary | ICD-10-CM | POA: Diagnosis not present

## 2023-04-18 DIAGNOSIS — R7303 Prediabetes: Secondary | ICD-10-CM | POA: Diagnosis not present

## 2023-04-18 DIAGNOSIS — Z13220 Encounter for screening for lipoid disorders: Secondary | ICD-10-CM | POA: Diagnosis not present

## 2023-04-18 DIAGNOSIS — R030 Elevated blood-pressure reading, without diagnosis of hypertension: Secondary | ICD-10-CM | POA: Diagnosis not present

## 2023-05-26 DIAGNOSIS — G4733 Obstructive sleep apnea (adult) (pediatric): Secondary | ICD-10-CM | POA: Diagnosis not present

## 2023-05-26 DIAGNOSIS — E669 Obesity, unspecified: Secondary | ICD-10-CM | POA: Diagnosis not present

## 2023-05-26 DIAGNOSIS — I10 Essential (primary) hypertension: Secondary | ICD-10-CM | POA: Diagnosis not present

## 2023-05-31 DIAGNOSIS — I10 Essential (primary) hypertension: Secondary | ICD-10-CM | POA: Diagnosis not present

## 2023-05-31 DIAGNOSIS — T50905A Adverse effect of unspecified drugs, medicaments and biological substances, initial encounter: Secondary | ICD-10-CM | POA: Diagnosis not present

## 2023-06-16 DIAGNOSIS — J180 Bronchopneumonia, unspecified organism: Secondary | ICD-10-CM | POA: Diagnosis not present

## 2023-06-16 DIAGNOSIS — J45909 Unspecified asthma, uncomplicated: Secondary | ICD-10-CM | POA: Diagnosis not present

## 2023-06-16 DIAGNOSIS — R079 Chest pain, unspecified: Secondary | ICD-10-CM | POA: Diagnosis not present

## 2023-06-16 DIAGNOSIS — I10 Essential (primary) hypertension: Secondary | ICD-10-CM | POA: Diagnosis not present

## 2023-06-16 DIAGNOSIS — J189 Pneumonia, unspecified organism: Secondary | ICD-10-CM | POA: Diagnosis not present

## 2023-06-16 DIAGNOSIS — R059 Cough, unspecified: Secondary | ICD-10-CM | POA: Diagnosis not present

## 2023-07-03 DIAGNOSIS — J189 Pneumonia, unspecified organism: Secondary | ICD-10-CM | POA: Diagnosis not present

## 2023-07-18 DIAGNOSIS — I10 Essential (primary) hypertension: Secondary | ICD-10-CM | POA: Diagnosis not present

## 2024-05-10 DIAGNOSIS — I10 Essential (primary) hypertension: Secondary | ICD-10-CM | POA: Diagnosis not present

## 2024-05-10 DIAGNOSIS — G4733 Obstructive sleep apnea (adult) (pediatric): Secondary | ICD-10-CM | POA: Diagnosis not present

## 2024-08-09 ENCOUNTER — Encounter: Payer: Self-pay | Admitting: Orthopedic Surgery

## 2024-08-10 ENCOUNTER — Ambulatory Visit: Admitting: Orthopedic Surgery

## 2024-08-10 VITALS — BP 132/89 | HR 68 | Temp 96.9°F | Resp 18 | Ht 75.0 in | Wt 274.0 lb

## 2024-08-10 DIAGNOSIS — Z13220 Encounter for screening for lipoid disorders: Secondary | ICD-10-CM | POA: Diagnosis not present

## 2024-08-10 DIAGNOSIS — Z87891 Personal history of nicotine dependence: Secondary | ICD-10-CM | POA: Diagnosis not present

## 2024-08-10 DIAGNOSIS — R7303 Prediabetes: Secondary | ICD-10-CM | POA: Diagnosis not present

## 2024-08-10 DIAGNOSIS — I10 Essential (primary) hypertension: Secondary | ICD-10-CM | POA: Diagnosis not present

## 2024-08-10 DIAGNOSIS — G4733 Obstructive sleep apnea (adult) (pediatric): Secondary | ICD-10-CM | POA: Diagnosis not present

## 2024-08-10 DIAGNOSIS — E6609 Other obesity due to excess calories: Secondary | ICD-10-CM | POA: Diagnosis not present

## 2024-08-10 DIAGNOSIS — E66811 Obesity, class 1: Secondary | ICD-10-CM

## 2024-08-10 DIAGNOSIS — Z6834 Body mass index (BMI) 34.0-34.9, adult: Secondary | ICD-10-CM

## 2024-08-10 DIAGNOSIS — J302 Other seasonal allergic rhinitis: Secondary | ICD-10-CM | POA: Insufficient documentation

## 2024-08-10 DIAGNOSIS — J4520 Mild intermittent asthma, uncomplicated: Secondary | ICD-10-CM | POA: Diagnosis not present

## 2024-08-10 MED ORDER — LOSARTAN POTASSIUM-HCTZ 50-12.5 MG PO TABS: 1.0000 | ORAL_TABLET | Freq: Every day | ORAL | 2 refills | Status: AC

## 2024-08-10 MED ORDER — ALBUTEROL SULFATE HFA 108 (90 BASE) MCG/ACT IN AERS: 2.0000 | INHALATION_SPRAY | Freq: Four times a day (QID) | RESPIRATORY_TRACT | 2 refills | Status: AC | PRN
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
## 2024-08-10 NOTE — Patient Instructions (Addendum)
 Cut down the sugary drinks to one daily   Recommend eye exam sometime in 2026

## 2024-08-11 LAB — COMPLETE METABOLIC PANEL WITHOUT GFR
AG Ratio: 1.6 (calc) (ref 1.0–2.5)
ALT: 34 U/L (ref 9–46)
AST: 26 U/L (ref 10–40)
Albumin: 4.6 g/dL (ref 3.6–5.1)
Alkaline phosphatase (APISO): 54 U/L (ref 36–130)
BUN: 12 mg/dL (ref 7–25)
CO2: 27 mmol/L (ref 20–32)
Calcium: 9.2 mg/dL (ref 8.6–10.3)
Chloride: 103 mmol/L (ref 98–110)
Creat: 0.86 mg/dL (ref 0.60–1.29)
Globulin: 2.9 g/dL (ref 1.9–3.7)
Glucose, Bld: 124 mg/dL — ABNORMAL HIGH (ref 65–99)
Potassium: 4.2 mmol/L (ref 3.5–5.3)
Sodium: 138 mmol/L (ref 135–146)
Total Bilirubin: 0.4 mg/dL (ref 0.2–1.2)
Total Protein: 7.5 g/dL (ref 6.1–8.1)

## 2024-08-11 LAB — HEMOGLOBIN A1C
Hgb A1c MFr Bld: 6.6 % — ABNORMAL HIGH
Mean Plasma Glucose: 143 mg/dL
eAG (mmol/L): 7.9 mmol/L

## 2024-08-11 LAB — CBC WITH DIFFERENTIAL/PLATELET
Absolute Lymphocytes: 2168 {cells}/uL (ref 850–3900)
Absolute Monocytes: 382 {cells}/uL (ref 200–950)
Basophils Absolute: 32 {cells}/uL (ref 0–200)
Basophils Relative: 0.6 %
Eosinophils Absolute: 307 {cells}/uL (ref 15–500)
Eosinophils Relative: 5.8 %
HCT: 43.6 % (ref 39.4–51.1)
Hemoglobin: 14 g/dL (ref 13.2–17.1)
MCH: 23.5 pg — ABNORMAL LOW (ref 27.0–33.0)
MCHC: 32.1 g/dL (ref 31.6–35.4)
MCV: 73.3 fL — ABNORMAL LOW (ref 81.4–101.7)
MPV: 9.7 fL (ref 7.5–12.5)
Monocytes Relative: 7.2 %
Neutro Abs: 2412 {cells}/uL (ref 1500–7800)
Neutrophils Relative %: 45.5 %
Platelets: 246 Thousand/uL (ref 140–400)
RBC: 5.95 Million/uL — ABNORMAL HIGH (ref 4.20–5.80)
RDW: 14.5 % (ref 11.0–15.0)
Total Lymphocyte: 40.9 %
WBC: 5.3 Thousand/uL (ref 3.8–10.8)

## 2024-08-11 LAB — LIPID PANEL
Cholesterol: 150 mg/dL
HDL: 44 mg/dL
LDL Cholesterol (Calc): 83 mg/dL
Non-HDL Cholesterol (Calc): 106 mg/dL
Total CHOL/HDL Ratio: 3.4 (calc)
Triglycerides: 125 mg/dL

## 2024-08-12 ENCOUNTER — Ambulatory Visit: Payer: Self-pay | Admitting: Orthopedic Surgery

## 2024-08-12 DIAGNOSIS — R7303 Prediabetes: Secondary | ICD-10-CM

## 2024-09-20 ENCOUNTER — Ambulatory Visit (INDEPENDENT_AMBULATORY_CARE_PROVIDER_SITE_OTHER): Admitting: Orthopedic Surgery

## 2024-09-20 ENCOUNTER — Encounter: Payer: Self-pay | Admitting: Orthopedic Surgery

## 2024-09-20 VITALS — BP 130/74 | HR 82 | Temp 97.7°F | Ht 75.0 in | Wt 271.6 lb

## 2024-09-20 DIAGNOSIS — G4733 Obstructive sleep apnea (adult) (pediatric): Secondary | ICD-10-CM

## 2024-09-20 DIAGNOSIS — I10 Essential (primary) hypertension: Secondary | ICD-10-CM | POA: Diagnosis not present

## 2024-09-20 DIAGNOSIS — E1165 Type 2 diabetes mellitus with hyperglycemia: Secondary | ICD-10-CM

## 2024-09-20 DIAGNOSIS — J4520 Mild intermittent asthma, uncomplicated: Secondary | ICD-10-CM

## 2024-09-20 NOTE — Progress Notes (Signed)
 "   Careteam: Patient Care Team: Gil Greig BRAVO, NP as PCP - General (Adult Health Nurse Practitioner)  Seen by: Greig Gil, AGNP-C  PLACE OF SERVICE:  [facility]  Advanced Directive information    Allergies[1]  Chief Complaint  Patient presents with   Annual Exam     HPI: Patient is a 39 y.o. male seen today for medical management of chronic conditions.   Discussed the use of AI scribe software for clinical note transcription with the patient, who gave verbal consent to proceed.  History of Present Illness   Lawrence David is a 39 year old male with hypertension and diabetes who presents for follow-up on blood pressure and blood sugar management.  He is currently taking losartan - hydrochlorothiazide for hypertension, with blood pressure well-controlled at 130/74 mmHg. He feels better since starting the medication. He is trying to limit sodium in diet.   Recent A1c was 6.6. He is managing diabetes by reducing sugar intake, cutting back on sodas to one 12-ounce soda every other day, and diluting sweet tea with water. He is working on dietary changes, such as reducing carbohydrate intake and increasing water consumption. Recent stress due to a friend's troubles has impacted his dietary habits. He is trying to cope with stress in healthier ways and is motivated to improve his health for his family.  He reports possible EKG in 2024 with pneumonia diagnosis.   Review of Systems:  Review of Systems  Constitutional: Negative.   HENT: Negative.    Eyes: Negative.   Respiratory: Negative.    Cardiovascular: Negative.   Gastrointestinal: Negative.   Genitourinary: Negative.   Musculoskeletal: Negative.   Skin: Negative.   Neurological: Negative.   Endo/Heme/Allergies:  Negative for polydipsia.  Psychiatric/Behavioral: Negative.      Past Medical History:  Diagnosis Date   Asthma    Hypertension    Sleep apnea    Past Surgical History:  Procedure Laterality Date    ANTERIOR CRUCIATE LIGAMENT REPAIR     Social History:   reports that he has quit smoking. His smoking use included cigars. He has never used smokeless tobacco. He reports current alcohol use. He reports that he does not use drugs.  Family History  Problem Relation Age of Onset   Hyperlipidemia Father     Medications: Patient's Medications  New Prescriptions   No medications on file  Previous Medications   ALBUTEROL  (VENTOLIN  HFA) 108 (90 BASE) MCG/ACT INHALER    Inhale 2 puffs into the lungs every 6 (six) hours as needed for wheezing or shortness of breath.   BENZONATATE  (TESSALON ) 200 MG CAPSULE    Take 200 mg by mouth every 8 (eight) hours as needed for cough.   IBUPROFEN  (ADVIL ,MOTRIN ) 200 MG TABLET    Take 800 mg by mouth every 6 (six) hours as needed for headache or mild pain.   LORATADINE (CLARITIN) 10 MG TABLET    Take 10 mg by mouth daily as needed for allergies.   LOSARTAN -HYDROCHLOROTHIAZIDE (HYZAAR) 50-12.5 MG TABLET    Take 1 tablet by mouth daily.   MULTIPLE VITAMIN PO    Take by mouth.   NON FORMULARY       TRIAMCINOLONE CREAM (KENALOG) 0.1 %    Apply 1 Application topically 2 (two) times daily.  Modified Medications   No medications on file  Discontinued Medications   No medications on file    Physical Exam:  Vitals:   09/20/24 0936  BP: 130/74  Pulse: 82  Temp: 97.7 F (36.5 C)  SpO2: 97%  Weight: 271 lb 9.6 oz (123.2 kg)  Height: 6' 3 (1.905 m)   Body mass index is 33.95 kg/m. Wt Readings from Last 3 Encounters:  09/20/24 271 lb 9.6 oz (123.2 kg)  08/10/24 274 lb (124.3 kg)  12/17/19 265 lb (120.2 kg)    Physical Exam Vitals reviewed.  Constitutional:      General: He is not in acute distress. HENT:     Head: Normocephalic.     Right Ear: There is no impacted cerumen.     Left Ear: There is no impacted cerumen.     Nose: Nose normal.     Mouth/Throat:     Pharynx: No oropharyngeal exudate or posterior oropharyngeal erythema.  Eyes:      General:        Right eye: No discharge.        Left eye: No discharge.  Cardiovascular:     Rate and Rhythm: Normal rate and regular rhythm.     Pulses: Normal pulses.     Heart sounds: Normal heart sounds.  Pulmonary:     Effort: Pulmonary effort is normal.     Breath sounds: Normal breath sounds.  Abdominal:     General: Bowel sounds are normal. There is no distension.     Palpations: Abdomen is soft.     Tenderness: There is no abdominal tenderness.  Musculoskeletal:     Cervical back: Neck supple.     Right lower leg: No edema.     Left lower leg: No edema.  Skin:    General: Skin is warm.     Capillary Refill: Capillary refill takes less than 2 seconds.  Neurological:     General: No focal deficit present.     Mental Status: He is alert and oriented to person, place, and time.  Psychiatric:        Mood and Affect: Mood normal.     Labs reviewed: Basic Metabolic Panel: Recent Labs    08/10/24 1055  NA 138  K 4.2  CL 103  CO2 27  GLUCOSE 124*  BUN 12  CREATININE 0.86  CALCIUM 9.2   Liver Function Tests: Recent Labs    08/10/24 1055  AST 26  ALT 34  BILITOT 0.4  PROT 7.5   No results for input(s): LIPASE, AMYLASE in the last 8760 hours. No results for input(s): AMMONIA in the last 8760 hours. CBC: Recent Labs    08/10/24 1055  WBC 5.3  NEUTROABS 2,412  HGB 14.0  HCT 43.6  MCV 73.3*  PLT 246   Lipid Panel: Recent Labs    08/10/24 1055  CHOL 150  HDL 44  LDLCALC 83  TRIG 125  CHOLHDL 3.4   TSH: No results for input(s): TSH in the last 8760 hours. A1C: Lab Results  Component Value Date   HGBA1C 6.6 (H) 08/10/2024     Assessment/Plan 1. Type 2 diabetes mellitus with hyperglycemia, without long-term current use of insulin (HCC) (Primary) - recent A1c 6.6 - dietary changes recommended - plan to recheck A1c 10/2024  2. Primary hypertension - controlled with losartan -hydrochlorothiazide - continue low sodium diet - EKG  next encounter  3. Obstructive sleep apnea syndrome - cont CPAP qhs  4. Mild intermittent asthma without complication - no exacerbations - cont albuterol  prn   Total time: 35 minutes. Greater than 50% of total time spent doing patient education regarding health maintenance, T2DM, HTN, OSA and asthma including  symptom/medications/diet interventions.     Next appt: 11/22/2024  Greig Gil BODILY  [facility] [phone]      [1]  Allergies Allergen Reactions   Ace Inhibitors Cough   "

## 2024-09-20 NOTE — Patient Instructions (Addendum)
 Continue low carb diet  Cut out sugary drinks   Schedule lab visit end of March and then appointment with me

## 2024-11-09 ENCOUNTER — Other Ambulatory Visit

## 2024-11-22 ENCOUNTER — Ambulatory Visit: Admitting: Orthopedic Surgery
